# Patient Record
Sex: Female | Born: 1995 | Race: Black or African American | Hispanic: No | Marital: Single | Smoking: Never smoker
Health system: Southern US, Community
[De-identification: ages and names within clinical notes are randomized; demographics above are authoritative.]

---
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
## 2017-02-03 ENCOUNTER — Ambulatory Visit (INDEPENDENT_AMBULATORY_CARE_PROVIDER_SITE_OTHER): Payer: PRIVATE HEALTH INSURANCE

## 2017-02-03 ENCOUNTER — Ambulatory Visit (INDEPENDENT_AMBULATORY_CARE_PROVIDER_SITE_OTHER): Payer: PRIVATE HEALTH INSURANCE | Admitting: Orthopaedic Surgery

## 2017-02-03 ENCOUNTER — Encounter (INDEPENDENT_AMBULATORY_CARE_PROVIDER_SITE_OTHER): Payer: Self-pay | Admitting: Orthopaedic Surgery

## 2017-02-03 DIAGNOSIS — M25561 Pain in right knee: Secondary | ICD-10-CM

## 2017-02-03 DIAGNOSIS — G8929 Other chronic pain: Secondary | ICD-10-CM | POA: Diagnosis not present

## 2017-02-03 NOTE — Addendum Note (Signed)
Addended by: Albertina ParrGARCIA, Sreshta Cressler on: 02/03/2017 09:25 AM   Modules accepted: Orders

## 2017-02-03 NOTE — Progress Notes (Signed)
   Office Visit Note   Patient: Bianca Mathis           Date of Birth: Sep 02, 1995           MRN: [id number] Visit Date: 02/03/2017              Requested by: No referring provider defined for this encounter. PCP: Patient, No Pcp Per   Assessment & Plan: Visit Diagnoses:  1. Chronic pain of right knee     Plan: Impression is right knee pain patellofemoral dysfunction that has failed conservative treatment.  Recommend MRI to rule out structural abnormalities.  Follow-up after the MRI.  Follow-Up Instructions: Return in about 10 days (around 02/13/2017).   Orders:  Orders Placed This Encounter  Procedures  . XR KNEE 3 VIEW RIGHT   No orders of the defined types were placed in this encounter.     Procedures: No procedures performed   Clinical Data: No additional findings.   Subjective: Chief Complaint  Patient presents with  . Right Knee - Pain    Bianca Mathis is a healthy 21 year old female who has right knee pain playing the patella for quite some time.  She had a previous injury several years ago to her right knee.  She states that she has constant pain that is worse with prolonged standing and activity and with running.  She has tried physical therapy 3 times and this is not give her any relief.  She denies any numbness and tingling or significant swelling.    Review of Systems  Constitutional: Negative.   HENT: Negative.   Eyes: Negative.   Respiratory: Negative.   Cardiovascular: Negative.   Endocrine: Negative.   Musculoskeletal: Negative.   Neurological: Negative.   Hematological: Negative.   Psychiatric/Behavioral: Negative.   All other systems reviewed and are negative.    Objective: Vital Signs: There were no vitals taken for this visit.  Physical Exam  Constitutional: She is oriented to person, place, and time. She appears well-developed and well-nourished.  HENT:  Head: Normocephalic and atraumatic.  Eyes: EOM are normal.  Neck: Neck supple.   Pulmonary/Chest: Effort normal.  Abdominal: Soft.  Neurological: She is alert and oriented to person, place, and time.  Skin: Skin is warm. Capillary refill takes less than 2 seconds.  Psychiatric: She has a normal mood and affect. Her behavior is normal. Judgment and thought content normal.  Nursing note and vitals reviewed.   Ortho Exam Right knee exam shows no joint effusion.  Normal range of motion.  Collaterals and cruciates are stable.  Patella tracking is normal.  Negative J sign. Specialty Comments:  No specialty comments available.  Imaging: Xr Knee 3 View Right  Result Date: 02/03/2017 No acute or structural abnormalities.  Slight lateral patellar tilt    PMFS History: There are no active problems to display for this patient.  History reviewed. No pertinent past medical history.  History reviewed. No pertinent family history.  History reviewed. No pertinent surgical history. Social History   Occupational History  . Not on file  Tobacco Use  . Smoking status: Never Smoker  . Smokeless tobacco: Never Used  Substance and Sexual Activity  . Alcohol use: Not on file  . Drug use: Not on file  . Sexual activity: Not on file

## 2017-02-16 ENCOUNTER — Ambulatory Visit (INDEPENDENT_AMBULATORY_CARE_PROVIDER_SITE_OTHER): Payer: Self-pay | Admitting: Orthopaedic Surgery

## 2017-02-20 ENCOUNTER — Ambulatory Visit
Admission: RE | Admit: 2017-02-20 | Discharge: 2017-02-20 | Disposition: A | Discharge status: Home / Self Care | Payer: Self-pay | Source: Ambulatory Visit | Attending: Orthopaedic Surgery | Admitting: Orthopaedic Surgery

## 2017-02-20 DIAGNOSIS — G8929 Other chronic pain: Secondary | ICD-10-CM

## 2017-02-20 DIAGNOSIS — M25561 Pain in right knee: Principal | ICD-10-CM

## 2017-02-23 ENCOUNTER — Ambulatory Visit (INDEPENDENT_AMBULATORY_CARE_PROVIDER_SITE_OTHER): Payer: PRIVATE HEALTH INSURANCE | Admitting: Orthopaedic Surgery

## 2017-02-23 ENCOUNTER — Encounter (INDEPENDENT_AMBULATORY_CARE_PROVIDER_SITE_OTHER): Payer: Self-pay | Admitting: Orthopaedic Surgery

## 2017-02-23 DIAGNOSIS — M25561 Pain in right knee: Secondary | ICD-10-CM

## 2017-02-23 DIAGNOSIS — G8929 Other chronic pain: Secondary | ICD-10-CM

## 2017-02-23 NOTE — Progress Notes (Signed)
   Office Visit Note   Patient: Bianca Mathis           Date of Birth: October 18, 1995           MRN: [id number] Visit Date: 02/23/2017              Requested by: No referring provider defined for this encounter. PCP: Patient, No Pcp Per   Assessment & Plan: Visit Diagnoses:  1. Chronic pain of right knee     Plan: MRI is negative for any structural abnormalities.  Her TT-TG distance is 18 mm.  These results were discussed with the patient.  I do recommend physical therapy to help with the patellofemoral pain that she is experiencing.  No surgical intervention is indicated.  Follow-up as needed.  Follow-Up Instructions: Return if symptoms worsen or fail to improve.   Orders:  No orders of the defined types were placed in this encounter.  No orders of the defined types were placed in this encounter.     Procedures: No procedures performed   Clinical Data: No additional findings.   Subjective: Chief Complaint  Patient presents with  . Right Knee - Follow-up    Patient follows up today for her MRI results.  Knee feels the same.    Review of Systems   Objective: Vital Signs: There were no vitals taken for this visit.  Physical Exam  Ortho Exam Stable exam. Specialty Comments:  No specialty comments available.  Imaging: No results found.   PMFS History: There are no active problems to display for this patient.  History reviewed. No pertinent past medical history.  History reviewed. No pertinent family history.  History reviewed. No pertinent surgical history. Social History   Occupational History  . Not on file  Tobacco Use  . Smoking status: Never Smoker  . Smokeless tobacco: Never Used  Substance and Sexual Activity  . Alcohol use: Not on file  . Drug use: Not on file  . Sexual activity: Not on file

## 2017-09-17 ENCOUNTER — Other Ambulatory Visit: Payer: Self-pay

## 2017-09-17 ENCOUNTER — Emergency Department (HOSPITAL_COMMUNITY)
Admission: EM | Admit: 2017-09-17 | Discharge: 2017-09-18 | Disposition: A | Discharge status: Home / Self Care | Attending: Emergency Medicine | Admitting: Emergency Medicine

## 2017-09-17 ENCOUNTER — Encounter (HOSPITAL_COMMUNITY): Payer: Self-pay | Admitting: *Deleted

## 2017-09-17 DIAGNOSIS — M5430 Sciatica, unspecified side: Secondary | ICD-10-CM

## 2017-09-17 DIAGNOSIS — R3121 Asymptomatic microscopic hematuria: Secondary | ICD-10-CM | POA: Insufficient documentation

## 2017-09-17 DIAGNOSIS — M5431 Sciatica, right side: Secondary | ICD-10-CM | POA: Insufficient documentation

## 2017-09-17 DIAGNOSIS — M545 Low back pain: Secondary | ICD-10-CM | POA: Diagnosis present

## 2017-09-17 NOTE — ED Triage Notes (Signed)
Pt ambulatory with steady gait to triage room. Pain and numbness in the right back (and right buttocks) that has been "on and off for about a year, but more persistent over the past 2-3 days". Pt says she has been in PT for the same for tendonitis in her hip flexor. No meds PTA. No loss of control of urine or bowel, no new onset of weakness in extremities.

## 2017-09-18 ENCOUNTER — Encounter (HOSPITAL_COMMUNITY): Payer: Self-pay | Admitting: Emergency Medicine

## 2017-09-18 ENCOUNTER — Emergency Department (HOSPITAL_COMMUNITY)

## 2017-09-18 LAB — URINALYSIS, ROUTINE W REFLEX MICROSCOPIC
Bilirubin Urine: NEGATIVE
Glucose, UA: NEGATIVE mg/dL
Ketones, ur: NEGATIVE mg/dL
Leukocytes, UA: NEGATIVE
Nitrite: NEGATIVE
Protein, ur: NEGATIVE mg/dL
Specific Gravity, Urine: 1.02 (ref 1.005–1.030)
pH: 6 (ref 5.0–8.0)

## 2017-09-18 LAB — POC URINE PREG, ED: Preg Test, Ur: NEGATIVE

## 2017-09-18 MED ORDER — NAPROXEN 250 MG PO TABS
500.0000 mg | ORAL_TABLET | Freq: Once | ORAL | Status: AC
Start: 2017-09-18 — End: 2017-09-18
  Administered 2017-09-18: 500 mg via ORAL
  Filled 2017-09-18: qty 2

## 2017-09-18 MED ORDER — MELOXICAM 7.5 MG PO TABS: 7.5000 mg | ORAL_TABLET | Freq: Every day | ORAL | 0 refills | Status: DC

## 2017-09-18 MED ORDER — ACETAMINOPHEN 500 MG PO TABS
1000.0000 mg | ORAL_TABLET | Freq: Once | ORAL | Status: AC
  Administered 2017-09-18: 1000 mg via ORAL
  Filled 2017-09-18: qty 2

## 2017-09-18 NOTE — ED Provider Notes (Signed)
[facility] EMERGENCY DEPARTMENT Provider Note   CSN: [id number] Arrival date & time: 09/17/17  2136     History   Chief Complaint Chief Complaint  Patient presents with  . Back Pain    HPI Bianca Mathis is a 22 y.o. female.  The history is provided by the patient.  Back Pain   This is a chronic problem. The current episode started more than 1 week ago (over a year). The problem occurs constantly. The problem has not changed since onset.The pain is associated with no known injury. The pain is present in the gluteal region. The quality of the pain is described as stabbing. The pain radiates to the right thigh. The pain is at a severity of 5/10. The pain is moderate. The symptoms are aggravated by bending and certain positions. The pain is the same all the time. Pertinent negatives include no chest pain, no fever, no numbness, no weight loss, no headaches, no abdominal pain, no abdominal swelling, no bowel incontinence, no perianal numbness, no bladder incontinence, no dysuria, no pelvic pain, no leg pain, no paresthesias, no paresis, no tingling and no weakness. She has tried nothing for the symptoms. The treatment provided no relief. Risk factors include obesity.  Has done PT for this in the past.  Wants to know why it is still going on. No f/c/r.  No neuro deficits.  No bowel or bladder incontinence.    History reviewed. No pertinent past medical history.  There are no active problems to display for this patient.   History reviewed. No pertinent surgical history.   OB History   None      Home Medications    Prior to Admission medications   Not on File    Family History No family history on file.  Social History Social History   Tobacco Use  . Smoking status: Never Smoker  . Smokeless tobacco: Never Used  Substance Use Topics  . Alcohol use: Never    Frequency: Never  . Drug use: Never     Allergies   Shellfish allergy and  Nickel   Review of Systems Review of Systems  Constitutional: Negative for fever and weight loss.  HENT: Negative for sore throat and trouble swallowing.   Eyes: Negative for photophobia.  Respiratory: Negative for shortness of breath.   Cardiovascular: Negative for chest pain, palpitations and leg swelling.  Gastrointestinal: Negative for abdominal pain and bowel incontinence.  Genitourinary: Negative for bladder incontinence, difficulty urinating, dysuria, flank pain, genital sores and pelvic pain.  Musculoskeletal: Positive for back pain. Negative for gait problem, joint swelling, myalgias and neck pain.  Neurological: Negative for tingling, weakness, numbness, headaches and paresthesias.  Psychiatric/Behavioral: Negative for dysphoric mood.  All other systems reviewed and are negative.    Physical Exam Updated Vital Signs BP (!) 102/44   Pulse 80   Temp 98.9 F (37.2 C) (Oral)   Resp 18   LMP 08/23/2017   SpO2 99%   Physical Exam  Constitutional: She is oriented to person, place, and time. She appears well-developed and well-nourished. No distress.  HENT:  Head: Normocephalic and atraumatic.  Mouth/Throat: No oropharyngeal exudate.  Eyes: Pupils are equal, round, and reactive to light. Conjunctivae are normal.  Neck: Normal range of motion. Neck supple.  Cardiovascular: Normal rate, regular rhythm, normal heart sounds and intact distal pulses.  Pulmonary/Chest: Effort normal and breath sounds normal. No stridor. She has no wheezes. She has no rales.  Abdominal: Soft. Bowel sounds  are normal. She exhibits no mass. There is no tenderness. There is no rebound and no guarding.  Musculoskeletal: Normal range of motion. She exhibits no tenderness or deformity.       Right hip: Normal.       Left hip: Normal.       Right knee: Normal.       Left knee: Normal.       Right ankle: Normal. Achilles tendon normal.       Left ankle: Normal. Achilles tendon normal.       Cervical  back: Normal.       Thoracic back: Normal.       Lumbar back: Normal.  Neurological: She is alert and oriented to person, place, and time. She displays normal reflexes. No cranial nerve deficit or sensory deficit. She exhibits normal muscle tone.  Skin: Skin is warm and dry. Capillary refill takes less than 2 seconds. She is not diaphoretic.  Psychiatric: She has a normal mood and affect.     ED Treatments / Results  Labs (all labs ordered are listed, but only abnormal results are displayed) Results for orders placed or performed during the hospital encounter of 09/17/17  Urinalysis, Routine w reflex microscopic  Result Value Ref Range   Color, Urine YELLOW YELLOW   APPearance HAZY (A) CLEAR   Specific Gravity, Urine 1.020 1.005 - 1.030   pH 6.0 5.0 - 8.0   Glucose, UA NEGATIVE NEGATIVE mg/dL   Hgb urine dipstick MODERATE (A) NEGATIVE   Bilirubin Urine NEGATIVE NEGATIVE   Ketones, ur NEGATIVE NEGATIVE mg/dL   Protein, ur NEGATIVE NEGATIVE mg/dL   Nitrite NEGATIVE NEGATIVE   Leukocytes, UA NEGATIVE NEGATIVE   RBC / HPF 6-10 0 - 5 RBC/hpf   WBC, UA 0-5 0 - 5 WBC/hpf   Bacteria, UA RARE (A) NONE SEEN   Squamous Epithelial / LPF 6-10 0 - 5   Mucus PRESENT   POC Urine Pregnancy, ED ([facility] NOT [facility] at [facility])  Result Value Ref Range   Preg Test, Ur NEGATIVE NEGATIVE   Dg Lumbar Spine Complete  Result Date: 09/18/2017 CLINICAL DATA:  22 year old female with intermittent back pain x1 year. EXAM: LUMBAR SPINE - COMPLETE 4+ VIEW COMPARISON:  None. FINDINGS: There is no evidence of lumbar spine fracture. Alignment is normal. Intervertebral disc spaces are maintained. IMPRESSION: Negative. Electronically Signed   By: Elgie CollardArash  Radparvar M.D.   On: 09/18/2017 01:23    EKG None  Radiology Dg Lumbar Spine Complete  Result Date: 09/18/2017 CLINICAL DATA:  22 year old female with intermittent back pain x1 year. EXAM: LUMBAR SPINE - COMPLETE 4+ VIEW COMPARISON:  None. FINDINGS: There is no  evidence of lumbar spine fracture. Alignment is normal. Intervertebral disc spaces are maintained. IMPRESSION: Negative. Electronically Signed   By: Elgie CollardArash  Radparvar M.D.   On: 09/18/2017 01:23    Procedures Procedures (including critical care time)  Medications Ordered in ED Medications  naproxen (NAPROSYN) tablet 500 mg (500 mg Oral Given 09/18/17 0114)  acetaminophen (TYLENOL) tablet 1,000 mg (1,000 mg Oral Given 09/18/17 0114)     Gait is intact.    Final Clinical Impressions(s) / ED Diagnoses   NSAIDs heat and follow up with sports medicine.     Return for leg or calf swelling or pain, numbness, changes in vision or speech, fevers >100.4 unrelieved by medication, shortness of breath, intractable vomiting, or diarrhea, abdominal pain, Inability to tolerate liquids or food, cough, altered mental status or any concerns. No signs  of systemic illness or infection. The patient is nontoxic-appearing on exam and vital signs are within normal limits. Will refer to urology for microscopy hematuria as patient is asymptomatic.  I have reviewed the triage vital signs and the nursing notes. Pertinent labs &imaging results that were available during my care of the patient were reviewed by me and considered in my medical decision making (see chart for details).  After history, exam, and medical workup I feel the patient has been appropriately medically screened and is safe for discharge home. Pertinent diagnoses were discussed with the patient. Patient was given return precautions.   Elayjah Chaney, MD 09/18/17 [id number]

## 2019-01-13 ENCOUNTER — Emergency Department (HOSPITAL_COMMUNITY)
Admission: EM | Admit: 2019-01-13 | Discharge: 2019-01-13 | Disposition: A | Discharge status: Home / Self Care | Attending: Emergency Medicine | Admitting: Emergency Medicine

## 2019-01-13 ENCOUNTER — Encounter (HOSPITAL_COMMUNITY): Payer: Self-pay | Admitting: Emergency Medicine

## 2019-01-13 ENCOUNTER — Other Ambulatory Visit: Payer: Self-pay

## 2019-01-13 ENCOUNTER — Emergency Department (HOSPITAL_COMMUNITY)

## 2019-01-13 DIAGNOSIS — W228XXA Striking against or struck by other objects, initial encounter: Secondary | ICD-10-CM | POA: Diagnosis not present

## 2019-01-13 DIAGNOSIS — Y9301 Activity, walking, marching and hiking: Secondary | ICD-10-CM | POA: Insufficient documentation

## 2019-01-13 DIAGNOSIS — Y998 Other external cause status: Secondary | ICD-10-CM | POA: Diagnosis not present

## 2019-01-13 DIAGNOSIS — S90122A Contusion of left lesser toe(s) without damage to nail, initial encounter: Secondary | ICD-10-CM | POA: Insufficient documentation

## 2019-01-13 DIAGNOSIS — Y92018 Other place in single-family (private) house as the place of occurrence of the external cause: Secondary | ICD-10-CM | POA: Insufficient documentation

## 2019-01-13 DIAGNOSIS — Z79899 Other long term (current) drug therapy: Secondary | ICD-10-CM | POA: Diagnosis not present

## 2019-01-13 DIAGNOSIS — S99922A Unspecified injury of left foot, initial encounter: Secondary | ICD-10-CM | POA: Diagnosis present

## 2019-01-13 NOTE — ED Provider Notes (Signed)
[facility] EMERGENCY DEPARTMENT Provider Note   CSN: [id number] Arrival date & time: 01/13/19  1354     History   Chief Complaint Chief Complaint  Patient presents with  . Toe Pain    HPI Bianca Mathis is a 23 y.o. female.     23 year old female with no chronic medical conditions presents for evaluation of pain and bruising in her left fifth toe.  Patient was walking at home today and accidentally struck her toes on the corner of a coffee table.  She developed bruising in the left fifth toe wound was concerned she may have broken her toe so came in for evaluation.  No other injuries.  She denies any other foot or ankle pain.  She has otherwise been well this week without fever cough vomiting or diarrhea.  The history is provided by the patient.  Toe Pain    History reviewed. No pertinent past medical history.  There are no active problems to display for this patient.   History reviewed. No pertinent surgical history.   OB History   No obstetric history on file.      Home Medications    Prior to Admission medications   Medication Sig Start Date End Date Taking? Authorizing Provider  meloxicam (MOBIC) 7.5 MG tablet Take 1 tablet (7.5 mg total) by mouth daily. 09/18/17   Palumbo, April, MD    Family History No family history on file.  Social History Social History   Tobacco Use  . Smoking status: Never Smoker  . Smokeless tobacco: Never Used  Substance Use Topics  . Alcohol use: Never    Frequency: Never  . Drug use: Never     Allergies   Shellfish allergy and Nickel   Review of Systems Review of Systems  All systems reviewed and were reviewed and were negative except as stated in the HPI   Physical Exam Updated Vital Signs BP 117/74 (BP Location: Right Arm)   Pulse 76   Temp 98 F (36.7 C) (Temporal)   Resp 14   Ht 5\' 9"  (1.753 m)   Wt 93 kg   LMP 12/20/2018 (Exact Date)   SpO2 98%   BMI 30.27 kg/m   Physical  Exam Vitals signs and nursing note reviewed.  Constitutional:      General: She is not in acute distress.    Appearance: Normal appearance. She is well-developed.  HENT:     Head: Normocephalic and atraumatic.     Mouth/Throat:     Mouth: Mucous membranes are moist.  Eyes:     Conjunctiva/sclera: Conjunctivae normal.     Pupils: Pupils are equal, round, and reactive to light.  Neck:     Musculoskeletal: Normal range of motion and neck supple.  Cardiovascular:     Rate and Rhythm: Normal rate and regular rhythm.     Heart sounds: Normal heart sounds. No murmur. No friction rub. No gallop.   Pulmonary:     Effort: Pulmonary effort is normal. No respiratory distress.     Breath sounds: No wheezing or rales.  Abdominal:     General: Bowel sounds are normal.     Palpations: Abdomen is soft.     Tenderness: There is no abdominal tenderness. There is no guarding or rebound.  Musculoskeletal: Normal range of motion.        General: Tenderness present.     Comments: Tenderness and mild soft tissue swelling of the left great toe with 1 cm contusion.  No deformity.  Nail is intact, no nailbed injury.  No lacerations or bleeding.  The remainder of the left foot is normal without swelling or tenderness.  Neurovascularly intact.  Skin:    General: Skin is warm and dry.     Capillary Refill: Capillary refill takes less than 2 seconds.     Findings: No rash.  Neurological:     General: No focal deficit present.     Mental Status: She is alert and oriented to person, place, and time.     Cranial Nerves: No cranial nerve deficit.     Comments: Normal strength 5/5 in upper and lower extremities, normal coordination      ED Treatments / Results  Labs (all labs ordered are listed, but only abnormal results are displayed) Labs Reviewed - No data to display  EKG None  Radiology Dg Foot Complete Left  Result Date: 01/13/2019 CLINICAL DATA:  Toe injury. EXAM: LEFT FOOT - COMPLETE 3+ VIEW  COMPARISON:  No prior. FINDINGS: Soft tissue structures are unremarkable. No acute bony abnormality. No evidence of fracture or dislocation. IMPRESSION: No acute abnormality. Electronically Signed   By: Marcello Moores  Register   On: 01/13/2019 14:58    Procedures Procedures (including critical care time)  Medications Ordered in ED Medications - No data to display   Initial Impression / Assessment and Plan / ED Course  I have reviewed the triage vital signs and the nursing notes.  Pertinent labs & imaging results that were available during my care of the patient were reviewed by me and considered in my medical decision making (see chart for details).       23 year old female with no chronic medical conditions presents with injury to the left fifth toe after she struck it on a coffee table at her home earlier today.  Developed mild swelling and bruising of the left fifth toe.  No other injuries.  On exam here vitals normal and well-appearing.  There is mild soft tissue swelling tenderness and bruising of the left great toe but no deformity and the nail is intact.  X-rays of the left foot are normal without evidence of any bony abnormality.  I personally reviewed these x-rays.  Offered ibuprofen but patient declined.  Discussed supportive care measures with cold compress 3 times daily for the next few days as well as ibuprofen as needed for pain.  PCP follow-up in 2 weeks if pain persist or worsens.  Final Clinical Impressions(s) / ED Diagnoses   Final diagnoses:  Contusion of fifth toe of left foot, initial encounter    ED Discharge Orders    None       Harlene Salts, MD 01/13/19 1614

## 2019-01-13 NOTE — ED Notes (Signed)
ED Provider at bedside. 

## 2019-01-13 NOTE — ED Triage Notes (Signed)
Pt reports hitting her toe on her coffee table and possibly breaking same. Pt here to get toes buddy taped.

## 2019-01-13 NOTE — Discharge Instructions (Addendum)
X-rays of the left foot and toes were normal today.  No evidence of broken bone.  You do have contusion/bruise of the toe.  May apply an ice pack for 10 to 15 minutes 3 times daily to help with pain and swelling.  May take ibuprofen 600 mg every 6-8 hours as needed for pain.  Wear a flat soled shoe for the next week.  Follow-up with your doctor if no improvement in 1 to 2 weeks.

## 2020-02-20 IMAGING — DX DG FOOT COMPLETE 3+V*L*
3 series · 3 of 3 positions shown · non-contrast
Comparison: No prior.

CLINICAL DATA: Toe injury.

EXAM:
LEFT FOOT - COMPLETE 3+ VIEW

[foot ap]
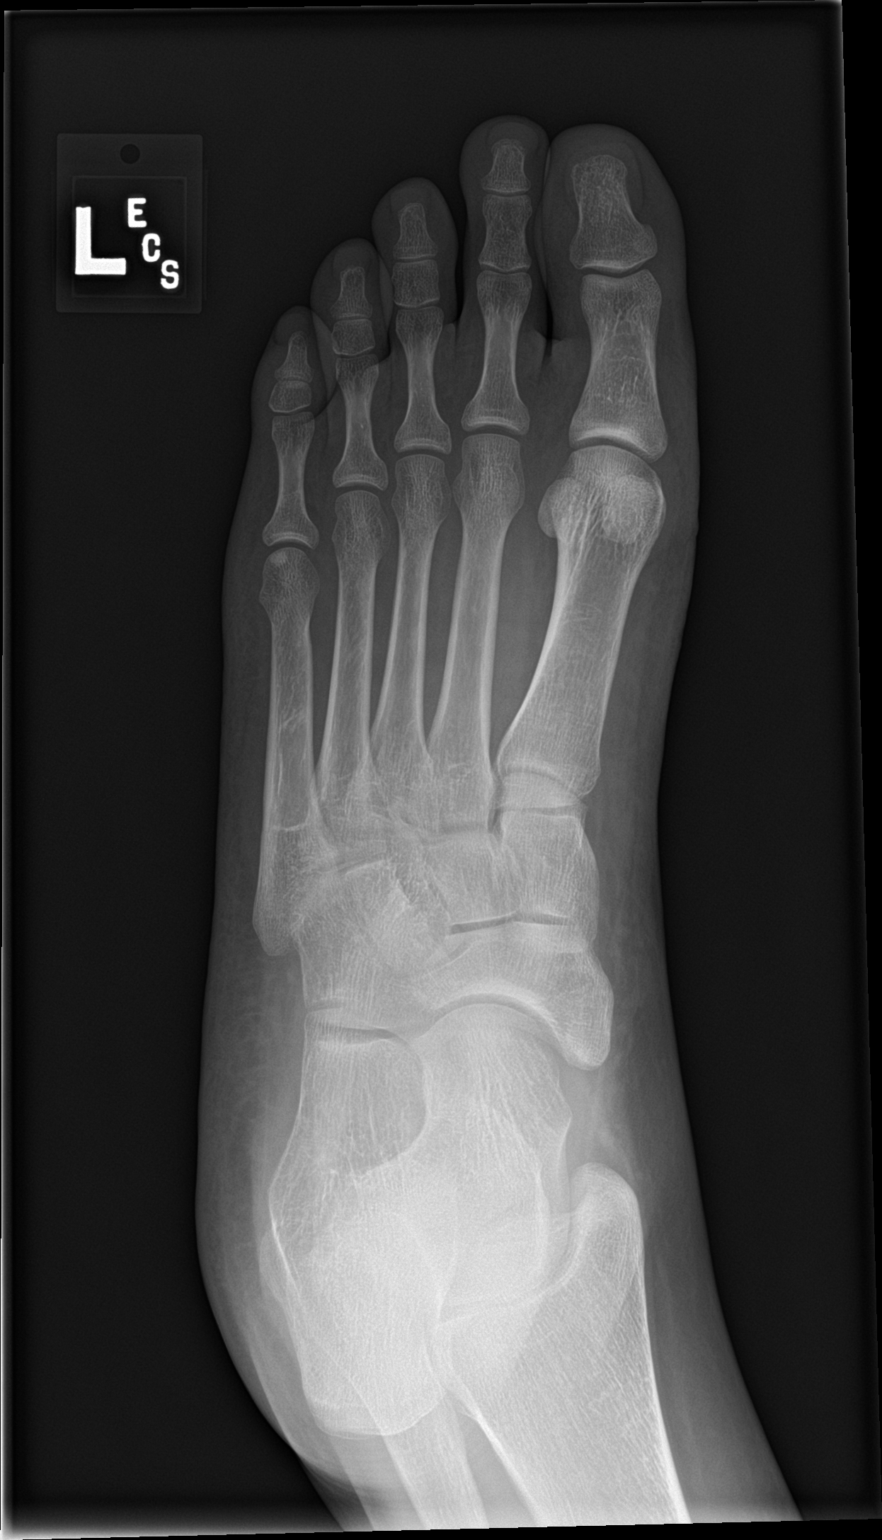

[foot obl]
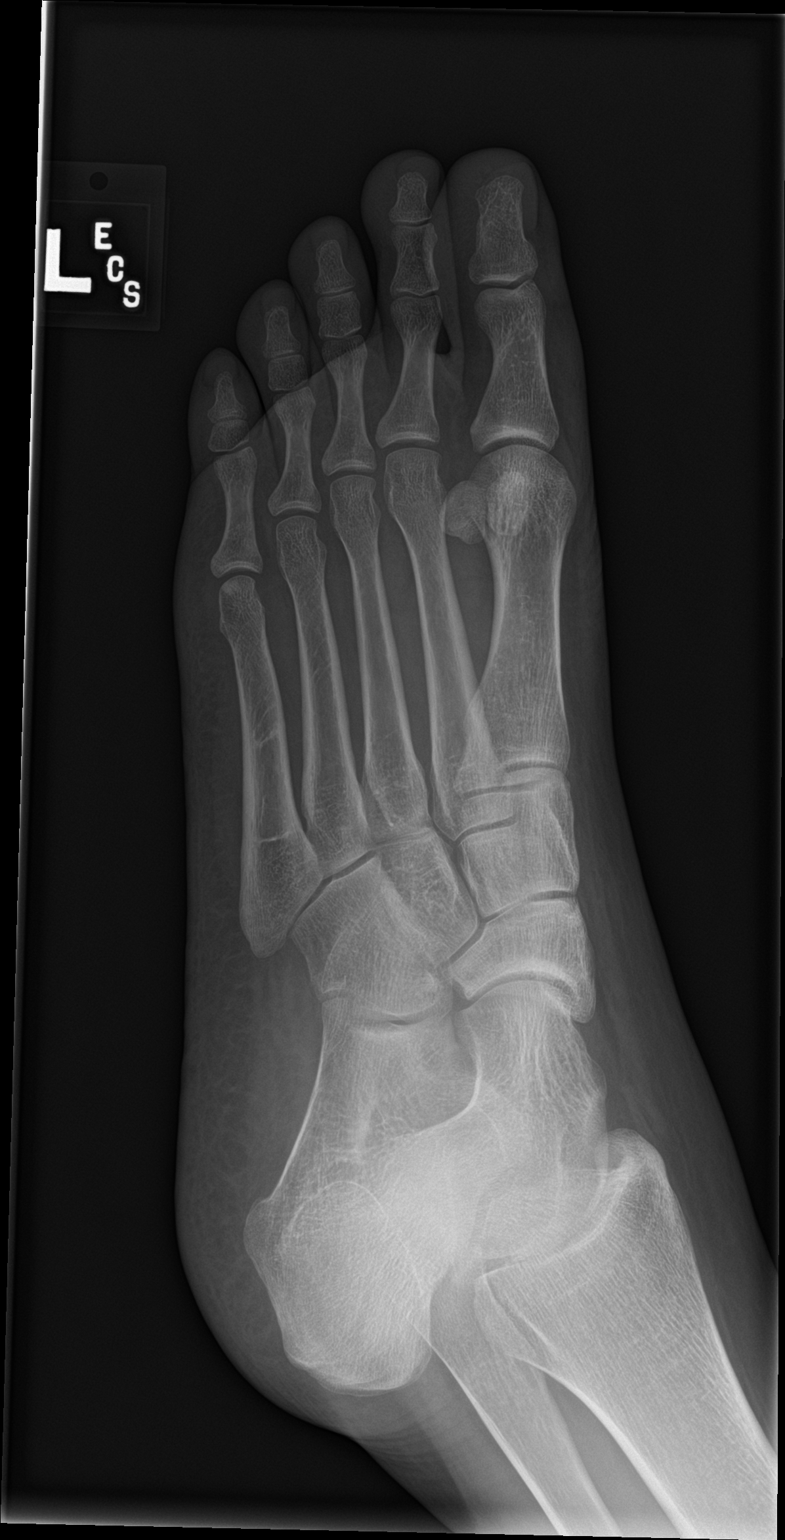

[foot lat]
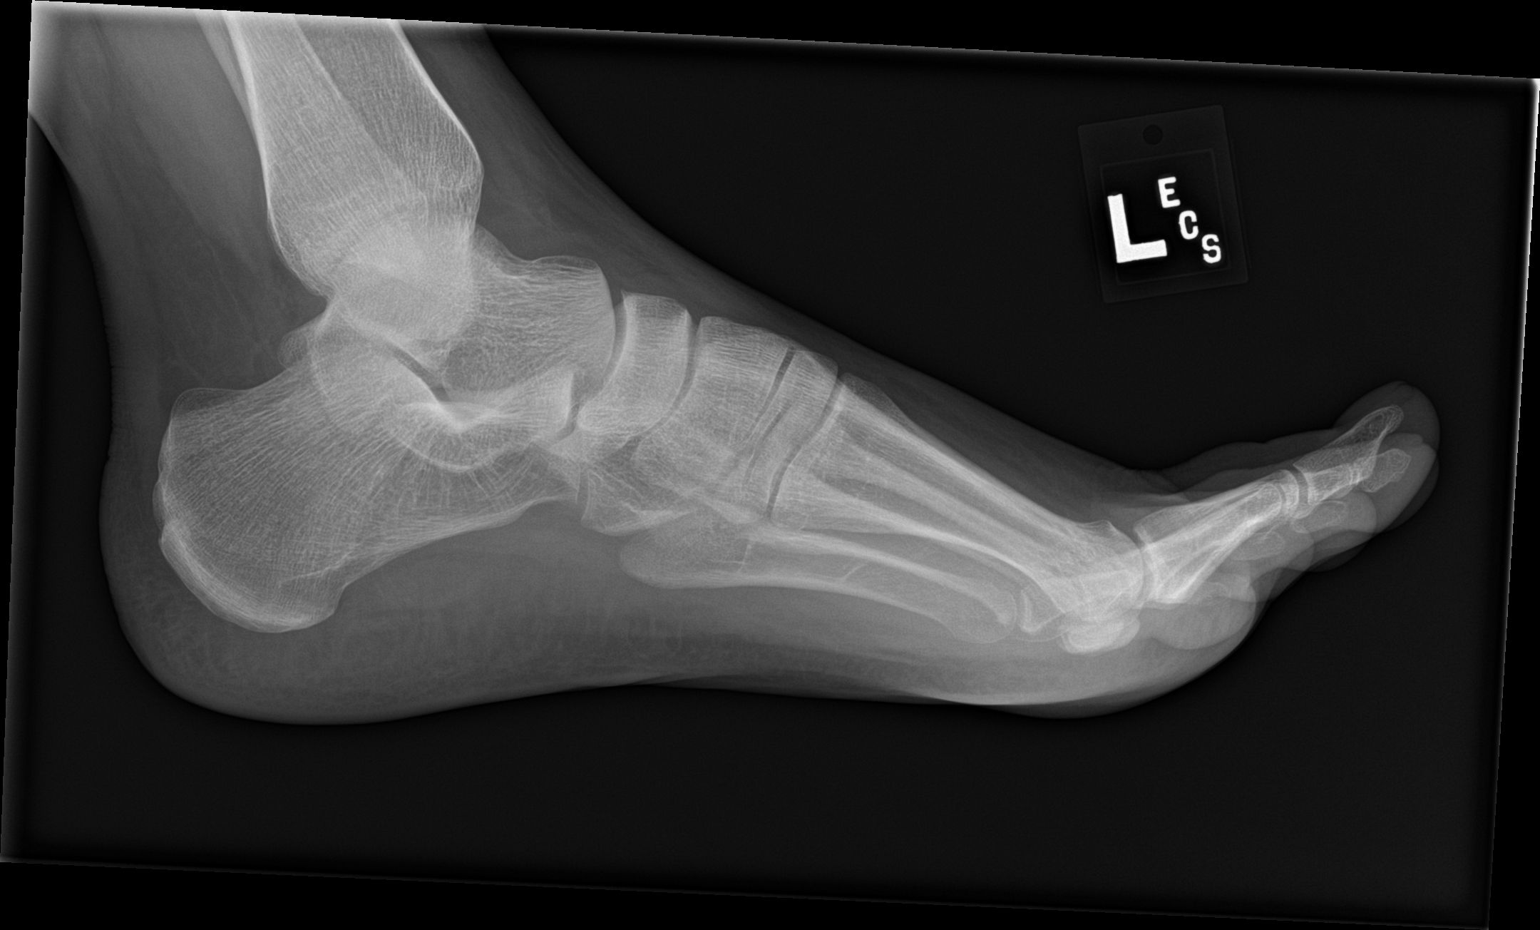

[3 of 3 positions shown; findings below may reference images not displayed]

FINDINGS: Soft tissue structures are unremarkable. No acute bony abnormality.
No evidence of fracture or dislocation.
IMPRESSION: No acute abnormality.

## 2020-06-29 ENCOUNTER — Encounter (HOSPITAL_COMMUNITY): Payer: Self-pay | Admitting: Emergency Medicine

## 2020-06-29 ENCOUNTER — Emergency Department (HOSPITAL_COMMUNITY): Payer: Self-pay

## 2020-06-29 ENCOUNTER — Emergency Department (HOSPITAL_COMMUNITY)
Admission: EM | Admit: 2020-06-29 | Discharge: 2020-06-29 | Disposition: A | Discharge status: Home / Self Care | Payer: Self-pay | Attending: Emergency Medicine | Admitting: Emergency Medicine

## 2020-06-29 ENCOUNTER — Ambulatory Visit (HOSPITAL_COMMUNITY)
Admission: EM | Admit: 2020-06-29 | Discharge: 2020-06-29 | Disposition: A | Discharge status: Home / Self Care | Attending: Medical Oncology | Admitting: Medical Oncology

## 2020-06-29 ENCOUNTER — Other Ambulatory Visit: Payer: Self-pay

## 2020-06-29 DIAGNOSIS — R1011 Right upper quadrant pain: Secondary | ICD-10-CM | POA: Insufficient documentation

## 2020-06-29 DIAGNOSIS — R1031 Right lower quadrant pain: Secondary | ICD-10-CM | POA: Insufficient documentation

## 2020-06-29 DIAGNOSIS — R1032 Left lower quadrant pain: Secondary | ICD-10-CM | POA: Insufficient documentation

## 2020-06-29 LAB — CBC WITH DIFFERENTIAL/PLATELET
Abs Immature Granulocytes: 0.03 K/uL (ref 0.00–0.07)
Basophils Absolute: 0 K/uL (ref 0.0–0.1)
Basophils Relative: 0 %
Eosinophils Absolute: 0.1 K/uL (ref 0.0–0.5)
Eosinophils Relative: 1 %
HCT: 32.6 % — ABNORMAL LOW (ref 36.0–46.0)
Hemoglobin: 10.9 g/dL — ABNORMAL LOW (ref 12.0–15.0)
Immature Granulocytes: 0 %
Lymphocytes Relative: 19 %
Lymphs Abs: 1.8 K/uL (ref 0.7–4.0)
MCH: 29.9 pg (ref 26.0–34.0)
MCHC: 33.4 g/dL (ref 30.0–36.0)
MCV: 89.6 fL (ref 80.0–100.0)
Monocytes Absolute: 0.8 K/uL (ref 0.1–1.0)
Monocytes Relative: 8 %
Neutro Abs: 7 K/uL (ref 1.7–7.7)
Neutrophils Relative %: 72 %
Platelets: 368 K/uL (ref 150–400)
RBC: 3.64 MIL/uL — ABNORMAL LOW (ref 3.87–5.11)
RDW: 12.4 % (ref 11.5–15.5)
WBC: 9.7 K/uL (ref 4.0–10.5)
nRBC: 0 % (ref 0.0–0.2)

## 2020-06-29 LAB — POCT URINALYSIS DIPSTICK, ED / UC
Glucose, UA: NEGATIVE mg/dL
Ketones, ur: 15 mg/dL — AB
Leukocytes,Ua: NEGATIVE
Nitrite: NEGATIVE
Protein, ur: 30 mg/dL — AB
Specific Gravity, Urine: 1.025 (ref 1.005–1.030)
Urobilinogen, UA: >=8 mg/dL (ref 0.0–1.0)
pH: 6 (ref 5.0–8.0)

## 2020-06-29 LAB — COMPREHENSIVE METABOLIC PANEL WITH GFR
ALT: 18 U/L (ref 0–44)
AST: 26 U/L (ref 15–41)
Albumin: 3.2 g/dL — ABNORMAL LOW (ref 3.5–5.0)
Alkaline Phosphatase: 83 U/L (ref 38–126)
Anion gap: 7 (ref 5–15)
BUN: <5 mg/dL — ABNORMAL LOW (ref 6–20)
CO2: 25 mmol/L (ref 22–32)
Calcium: 8.9 mg/dL (ref 8.9–10.3)
Chloride: 104 mmol/L (ref 98–111)
Creatinine, Ser: 0.66 mg/dL (ref 0.44–1.00)
GFR, Estimated: >60 mL/min (ref >60)
Glucose, Bld: 92 mg/dL (ref 70–99)
Potassium: 4.1 mmol/L (ref 3.5–5.1)
Sodium: 136 mmol/L (ref 135–145)
Total Bilirubin: 0.6 mg/dL (ref 0.3–1.2)
Total Protein: 7.1 g/dL (ref 6.5–8.1)

## 2020-06-29 LAB — LIPASE, BLOOD: Lipase: 36 U/L (ref 11–51)

## 2020-06-29 LAB — POC URINE PREG, ED: Preg Test, Ur: NEGATIVE

## 2020-06-29 MED ORDER — PREDNISONE 10 MG PO TABS: ORAL_TABLET | ORAL | 0 refills | Status: AC

## 2020-06-29 NOTE — ED Provider Notes (Signed)
[facility]    CSN: [id number] Arrival date & time: 06/29/20  1420      History   Chief Complaint Chief Complaint  Patient presents with  . Abdominal Pain    HPI Bianca Mathis is a 25 y.o. female.   HPI   Abdominal Pain: Patient states that she has had progressively worse right upper quadrant pain for the past 2 weeks.  In addition she states that her cycle lasted for 9 days instead of 4.  She states that her abdominal pain is dull 10-10 in nature when she takes a deep breath, bends or picks objects up.  4-10 at rest.  She denies any vomiting, fevers, changes in bowel habits but she does state that her urine has looked orange today. She has not tried anything for symptoms other than trying to stay hydrated with water.   History reviewed. No pertinent past medical history.  There are no problems to display for this patient.   History reviewed. No pertinent surgical history.  OB History   No obstetric history on file.      Home Medications    Prior to Admission medications   Medication Sig Start Date End Date Taking? Authorizing Provider  meloxicam (MOBIC) 7.5 MG tablet Take 1 tablet (7.5 mg total) by mouth daily. 09/18/17   Palumbo, April, MD    Family History History reviewed. No pertinent family history.  Social History Social History   Tobacco Use  . Smoking status: Never Smoker  . Smokeless tobacco: Never Used  Vaping Use  . Vaping Use: Never used  Substance Use Topics  . Alcohol use: Never  . Drug use: Never     Allergies   Shellfish allergy and Nickel   Review of Systems Review of Systems  As stated above in HPI Physical Exam Triage Vital Signs ED Triage Vitals  Enc Vitals Group     BP 06/29/20 1454 114/62     Pulse Rate 06/29/20 1454 (!) 105     Resp 06/29/20 1454 18     Temp 06/29/20 1454 98.7 F (37.1 C)     Temp Source 06/29/20 1454 Oral     SpO2 06/29/20 1454 100 %     Weight --      Height --      Head  Circumference --      Peak Flow --      Pain Score 06/29/20 1453 4     Pain Loc --      Pain Edu? --      Excl. in GC? --    No data found.  Updated Vital Signs BP 114/62 (BP Location: Right Arm)   Pulse (!) 105   Temp 98.7 F (37.1 C) (Oral)   Resp 18   LMP 06/19/2020   SpO2 100%   Physical Exam Vitals and nursing note reviewed.  Constitutional:      General: She is in acute distress (mild with bending movements).     Appearance: She is well-developed. She is not ill-appearing, toxic-appearing or diaphoretic.  HENT:     Head: Normocephalic and atraumatic.     Mouth/Throat:     Mouth: Mucous membranes are moist.  Eyes:     Extraocular Movements: Extraocular movements intact.  Cardiovascular:     Rate and Rhythm: Normal rate and regular rhythm.     Heart sounds: Normal heart sounds.  Pulmonary:     Effort: Pulmonary effort is normal.     Breath sounds: Normal  breath sounds.  Abdominal:     General: Abdomen is flat. Bowel sounds are normal. There is distension (mild).     Palpations: Abdomen is rigid. There is hepatomegaly. There is no pulsatile mass.     Tenderness: There is abdominal tenderness in the right upper quadrant. There is guarding. There is no right CVA tenderness, left CVA tenderness or rebound. Positive signs include McBurney's sign. Negative signs include Murphy's sign, Rovsing's sign, psoas sign and obturator sign.     Hernia: No hernia is present.  Skin:    General: Skin is warm.     Coloration: Skin is not jaundiced.  Neurological:     Mental Status: She is alert and oriented to person, place, and time.      UC Treatments / Results  Labs (all labs ordered are listed, but only abnormal results are displayed) Labs Reviewed  POCT URINALYSIS DIPSTICK, ED / UC - Abnormal; Notable for the following components:      Result Value   Bilirubin Urine SMALL (*)    Ketones, ur 15 (*)    Hgb urine dipstick LARGE (*)    Protein, ur 30 (*)    All other  components within normal limits  URINE CULTURE  POC URINE PREG, ED    EKG   Radiology No results found.  Procedures Procedures (including critical care time)  Medications Ordered in UC Medications - No data to display  Initial Impression / Assessment and Plan / UC Course  I have reviewed the triage vital signs and the nursing notes.  Pertinent labs & imaging results that were available during my care of the patient were reviewed by me and considered in my medical decision making (see chart for details).     New.  I discussed my concerns with patient.  She is agreeable to go to the emergency room.  She elects to go via private vehicle to rule out acute cholecystitis.  She will be n.p.o. until informed that she can eat or drink by ER staff. Final Clinical Impressions(s) / UC Diagnoses   Final diagnoses:  RUQ pain     Discharge Instructions     Please go directly to the emergency room     ED Prescriptions    None     PDMP not reviewed this encounter.   Rushie Chestnut, [place] 06/29/20 [phone]

## 2020-06-29 NOTE — ED Provider Notes (Signed)
[facility] EMERGENCY DEPARTMENT Provider Note   CSN: [id number] Arrival date & time: 06/29/20  1613     History Chief Complaint  Patient presents with  . Abdominal Pain    Bianca Mathis is a 25 y.o. female.  HPI Patient is a 25 year old female with a minimal medical history present with a chief complaint of right-sided abdominal pain progressive over the past 2 weeks.  Patient had notable change of her symptoms in the last 24 hours.  She endorses that she had a change in her urine today where her urine became orange which prompted her to come to the emergency department for further evaluation management.  Patient initially evaluated at the urgent care center and transferred to emergency department further evaluation management.  Patient endorses continued pain and it seems to be more right-sided pain but she does have general allover discomfort.  She Dors is a family history of ulcerative colitis as well as nephrolithiasis.  She personally has no history of either. Patient states she is been trying to take Tylenol but has not had no improvement of symptoms.  Movement makes her symptoms significantly worse.    History reviewed. No pertinent past medical history.  There are no problems to display for this patient.   History reviewed. No pertinent surgical history.   OB History   No obstetric history on file.     History reviewed. No pertinent family history.  Social History   Tobacco Use  . Smoking status: Never Smoker  . Smokeless tobacco: Never Used  Vaping Use  . Vaping Use: Never used  Substance Use Topics  . Alcohol use: Never  . Drug use: Never    Home Medications Prior to Admission medications   Medication Sig Start Date End Date Taking? Authorizing Provider  meloxicam (MOBIC) 7.5 MG tablet Take 1 tablet (7.5 mg total) by mouth daily. 09/18/17   Palumbo, April, MD    Allergies    Shellfish allergy and Nickel  Review of Systems   Review  of Systems  Constitutional: Negative for chills and fever.  HENT: Negative for ear pain and sore throat.   Eyes: Negative for pain and visual disturbance.  Respiratory: Negative for cough and shortness of breath.   Cardiovascular: Negative for chest pain and palpitations.  Gastrointestinal: Positive for abdominal pain. Negative for vomiting.  Genitourinary: Negative for dysuria and hematuria.  Musculoskeletal: Negative for arthralgias and back pain.  Skin: Negative for color change and rash.  Neurological: Negative for seizures and syncope.  All other systems reviewed and are negative.   Physical Exam Updated Vital Signs BP 106/69   Pulse 91   Temp 99.1 F (37.3 C) (Oral)   Resp 14   Ht 5\' 9"  (1.753 m)   Wt 95.3 kg   LMP 06/19/2020   SpO2 99%   BMI 31.01 kg/m   Physical Exam Vitals and nursing note reviewed.  Constitutional:      General: She is not in acute distress.    Appearance: She is well-developed.  HENT:     Head: Normocephalic and atraumatic.  Eyes:     Conjunctiva/sclera: Conjunctivae normal.  Cardiovascular:     Rate and Rhythm: Normal rate and regular rhythm.     Heart sounds: No murmur heard.   Pulmonary:     Effort: Pulmonary effort is normal. No respiratory distress.     Breath sounds: Normal breath sounds.  Abdominal:     Palpations: Abdomen is soft.  Tenderness: There is abdominal tenderness in the right upper quadrant, right lower quadrant and left lower quadrant. There is no right CVA tenderness, left CVA tenderness or guarding. Negative signs include Murphy's sign and McBurney's sign.  Musculoskeletal:     Cervical back: Neck supple.  Skin:    General: Skin is warm and dry.  Neurological:     Mental Status: She is alert.     ED Results / Procedures / Treatments   Labs (all labs ordered are listed, but only abnormal results are displayed) Labs Reviewed  COMPREHENSIVE METABOLIC PANEL - Abnormal; Notable for the following components:       Result Value   BUN <5 (*)    Albumin 3.2 (*)    All other components within normal limits  LIPASE, BLOOD    EKG None  Radiology US Abdomen Limited RUQ (LIVER/GB)  Result Date: 06/29/2020 CLINICAL DATA:  Right upper quadrant pain. EXAM: ULTRASOUND ABDOMEN LIMITED RIGHT UPPER QUADRANT COMPARISON:  None. FINDINGS: Gallbladder: Physiologically distended. No gallstones or wall thickening visualized. No sonographic Murphy sign noted by sonographer. Common bile duct: Diameter: 3 mm, normal. Liver: No focal lesion identified. Within normal limits in parenchymal echogenicity. Portal vein is patent on color Doppler imaging with normal direction of blood flow towards the liver. Other: No right upper quadrant ascites. IMPRESSION: Normal right upper quadrant ultrasound. Electronically Signed   By: Narda Rutherford M.D.   On: 06/29/2020 17:42   Medications Ordered in ED Medications - No data to display  ED Course  I have reviewed the triage vital signs and the nursing notes.  Pertinent labs & imaging results that were available during my care of the patient were reviewed by me and considered in my medical decision making (see chart for details).    MDM Rules/Calculators/A&P                           Medical Decision Making:  Bianca Mathis is a 25 y.o. female with no significant medical history, who presented to the ED today with chief complaint of right-sided abdominal pain right-sided upper quadrant to right sided lower quadrant primarily.   On my initial exam, the pt was in no acute distress.  She does have tenderness at the above locations.   Reviewed and confirmed nursing documentation for past medical history, family history, social history.  Patient evaluated in triage for suspected biliary disease with right upper quadrant ultrasound, LFTs with no focal abnormalities.  Urinalysis and UPT negative at this time Initial Assessment:  With the patient's presentation of right-sided  abdominal pain and urinary changes in the setting of negative biliary evaluation, most likely diagnosis is nephrolithiasis. Other diagnoses were considered including (but not limited to) appendicitis, cholangitis, small bowel obstruction. These are considered less likely due to history of present illness and physical exam findings.   Initial Plan: 1. Will add onto previous evaluation with CBC and CT abdomen pelvis stone study given presence of hematuria on dipstick 2. Objective evaluation as below reviewed independently and with attending guidance.   Initial Study Results:  Laboratory CBC showed no acute abnormality  Radiology US Abdomen Limited RUQ (LIVER/GB)  Final Result    CT ABDOMEN PELVIS WO CONTRAST    (Results Pending)    Final Assessment and Plan:  Given the scan findings of potential initiation of IBD and the strong family history,  Communicated with gastroenterology on-call.  They recommended initiation of steroid taper and outpatient follow-up.  Unfortunately, patient has limited follow-up options due to her social situation.  Patient will initiate care at the [facility] and follow-up with gastroenterology in the outpatient setting.  Final Clinical Impression(s) / ED Diagnoses Final diagnoses:  Right upper quadrant pain    Rx / DC Orders ED Discharge Orders    None       Glyn Ade, MD 06/29/20 2158    Clarene Duke Ambrose Finland, MD 07/02/20 [phone]

## 2020-06-29 NOTE — ED Triage Notes (Signed)
Patient from urgent care. Complaint of abdominal pain x2 weeks, worse today.

## 2020-06-29 NOTE — ED Triage Notes (Signed)
Emergency Medicine Provider Triage Evaluation Note  Bianca Mathis , a 25 y.o. female  was evaluated in triage.  Pt complains of  Right upper quadrant abdominal pain and bilateral lower abdominal pain.  Proggressively worse over the last 2 weeks.  No radiation of pain.  Minimal improvement of pain with tylenol.  Worse when bending over or deep inhalation.  No change with food.    Review of Systems  Positive: Diarrhea, shob Negative: Nausea, vomiting, fever, chills,  Blood in stool, chest pain  Physical Exam  BP 106/69   Pulse 91   Temp 99.1 F (37.3 C) (Oral)   Resp 14   LMP 06/19/2020   SpO2 99%  Gen:   Awake, no distress   HEENT:  Atraumatic  Resp:  Normal effort  Cardiac:  Normal rate  Abd:   Nondistended, soft, tenderness to RUQ LLQ and RLQ MSK:   Moves extremities without difficulty  Neuro:  Speech clear   Medical Decision Making  Medically screening exam initiated at 4:58 PM.  Appropriate orders placed.  Lemmie Steinhaus was informed that the remainder of the evaluation will be completed by another provider, this initial triage assessment does not replace that evaluation, and the importance of remaining in the ED until their evaluation is complete.  Clinical Impression   RUQ, LLQ and RLQ abdominal pain. The patient appears stable so that the remainder of the work up may be completed by another provider.     Haskel Schroeder, [place] 06/29/20 1704

## 2020-06-29 NOTE — Care Management (Signed)
ED RNCM met with patient at in ED rm20 to discuss assistance with f/u with GI.  Patient is uninsured and reports living in [place] ED RNCM discussed [facility] to establish primary care with referral to GI. Patient is agreeable with plan, ED CM will f/u on Monday with [facility] to assist with securing an appointment. Updated EDP on plan.

## 2020-06-29 NOTE — ED Triage Notes (Signed)
Pt presents abdominal pain xs 2 weeks. States cycle was 9 days rather than 4 days. States deep breaths, bending, and picking objects up makes pain worse.

## 2020-06-29 NOTE — Discharge Instructions (Addendum)
Please go directly to the emergency room 

## 2020-06-29 NOTE — Discharge Instructions (Signed)
You were seen today for abdominal pain.  Your work-up is overall not completely diagnostic.  Your CT scan does have some bowel wall thickening and signs of inflammation which given your family history of inflammatory bowel disease could be a sign of early IBD.

## 2020-07-02 ENCOUNTER — Telehealth (HOSPITAL_COMMUNITY): Payer: Self-pay | Admitting: Emergency Medicine

## 2020-07-02 LAB — URINE CULTURE: Culture: >=100000 — AB

## 2020-07-02 MED ORDER — SULFAMETHOXAZOLE-TRIMETHOPRIM 800-160 MG PO TABS: 1.0000 | ORAL_TABLET | Freq: Two times a day (BID) | ORAL | 0 refills | Status: AC

## 2020-07-02 NOTE — Telephone Encounter (Signed)
Called pharmacy and changed prescription for Bactrim to 3 days, per protocol.  Pharmacist cancelled old and placed new order.

## 2021-08-06 IMAGING — US US ABDOMEN LIMITED RUQ/ASCITES
1 series · 14 of 25 positions shown · non-contrast
Comparison: None.

CLINICAL DATA: Right upper quadrant pain.

EXAM:
ULTRASOUND ABDOMEN LIMITED RIGHT UPPER QUADRANT

[Series 1: us abdomen limited ruq (liver/gb) · 14 of 37 slices shown]
[im 1/37]
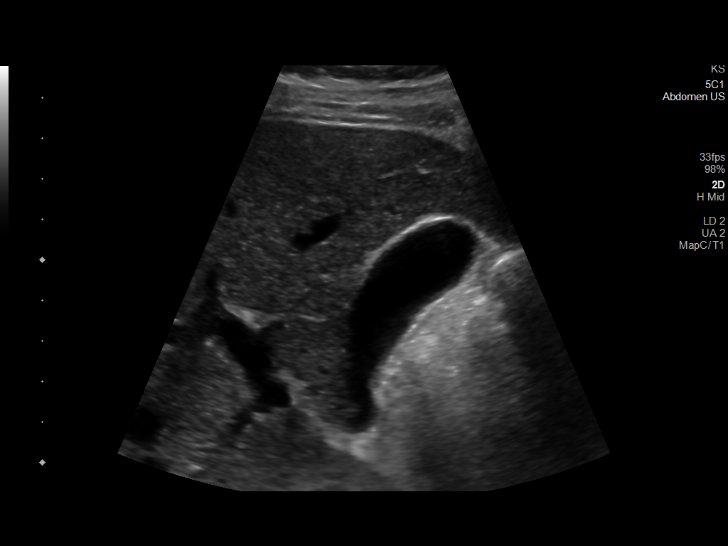
[im 4/37]
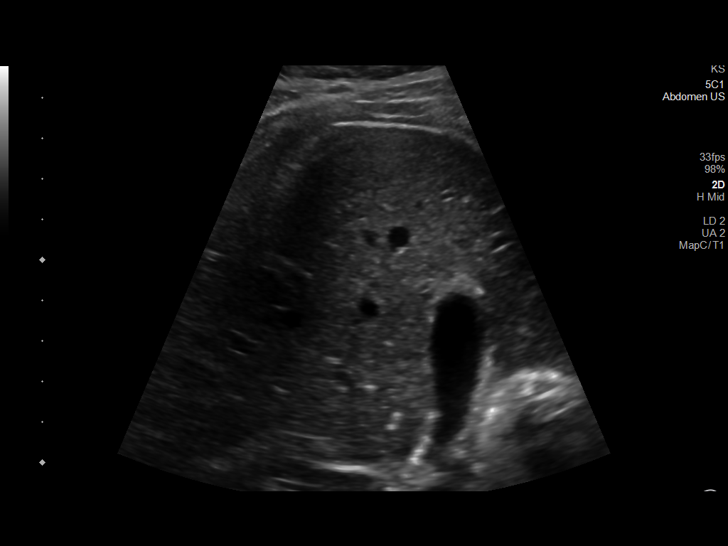
[im 7/37]
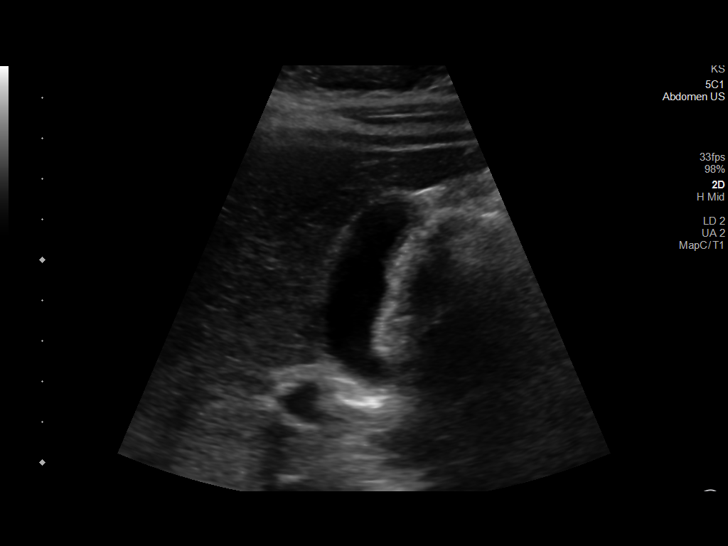
[im 10/37]
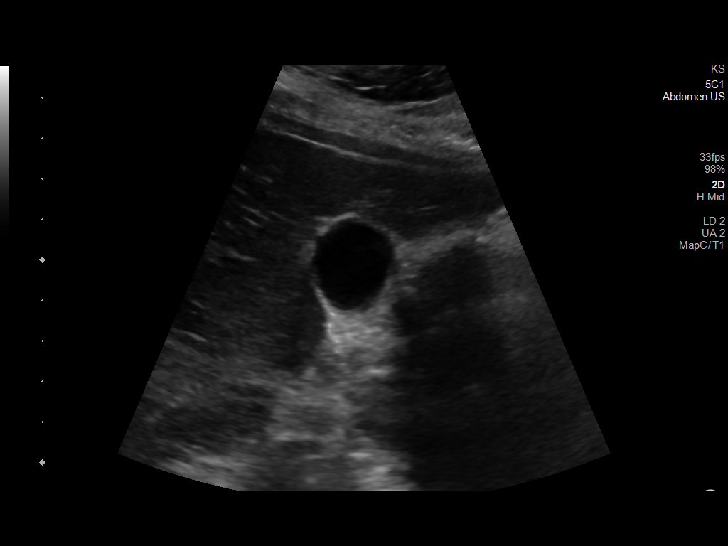
[im 13/37]
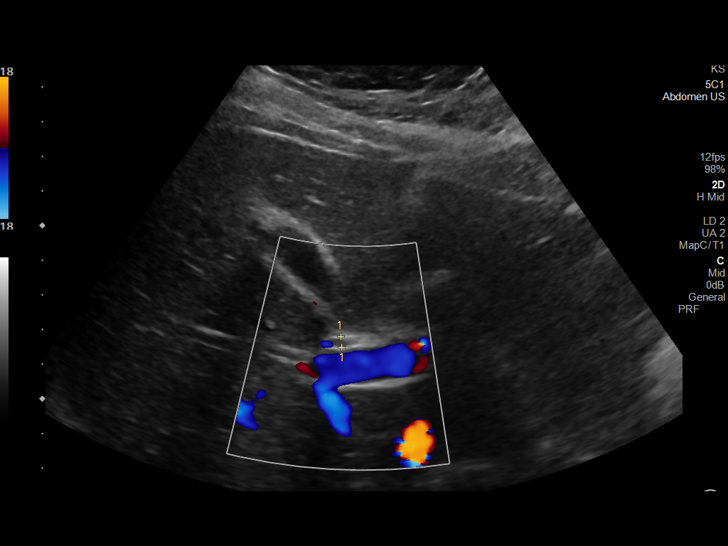
[im 14/37]
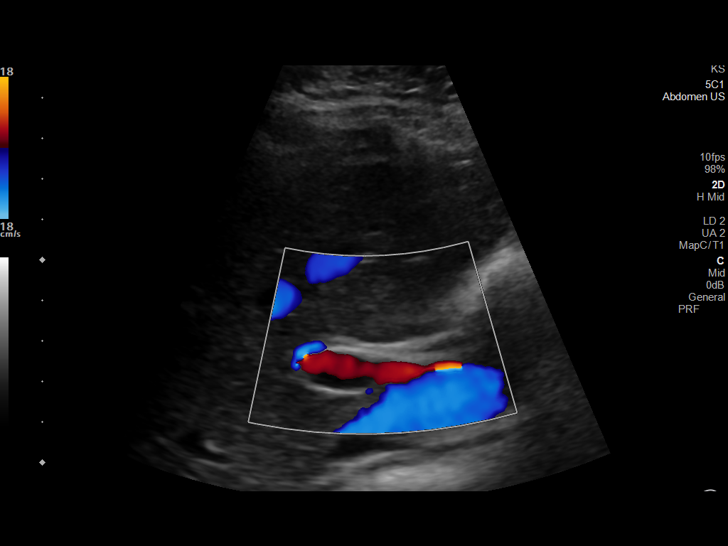
[im 17/37]
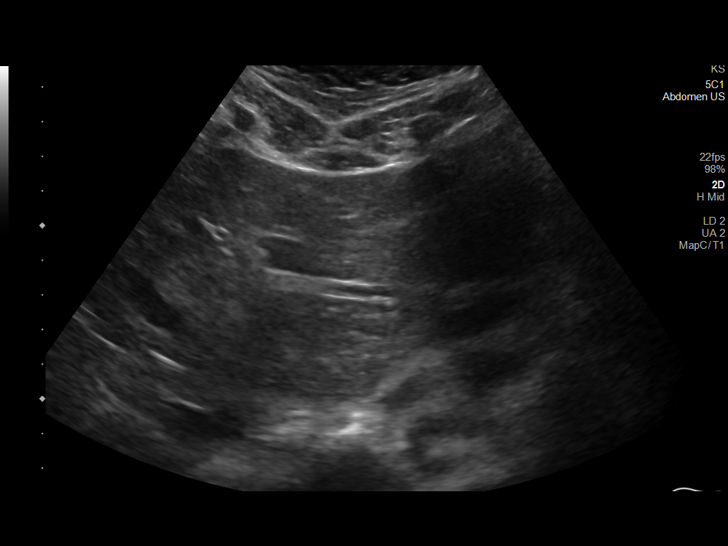
[im 20/37]
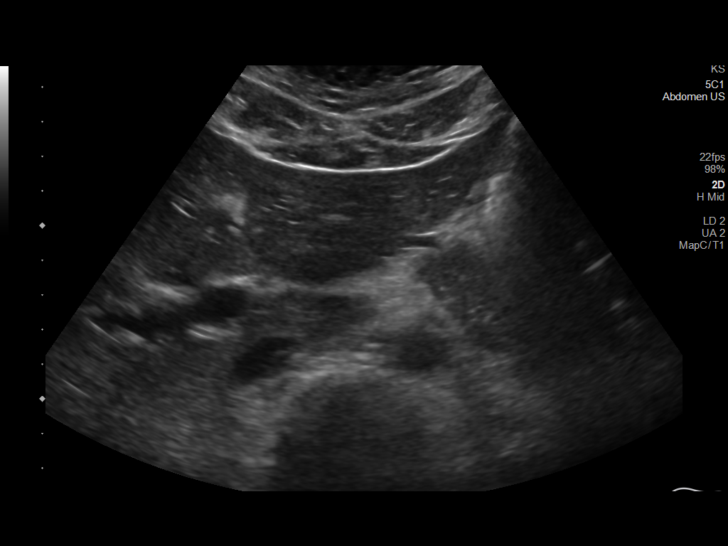
[im 23/37]
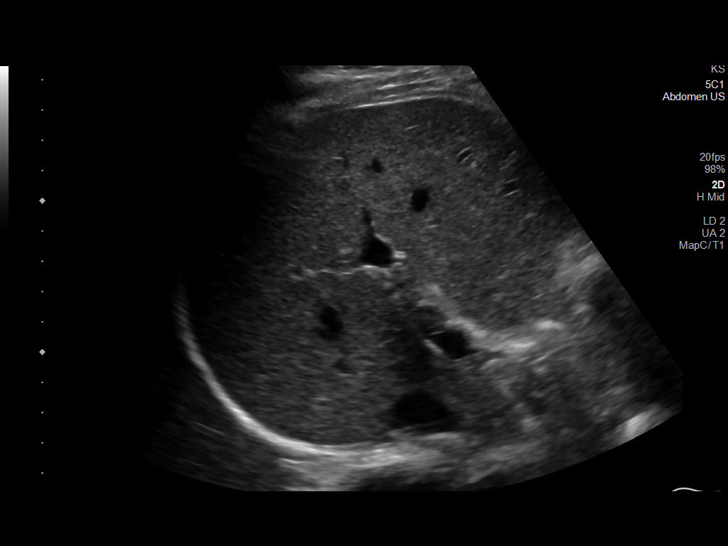
[im 25/37]
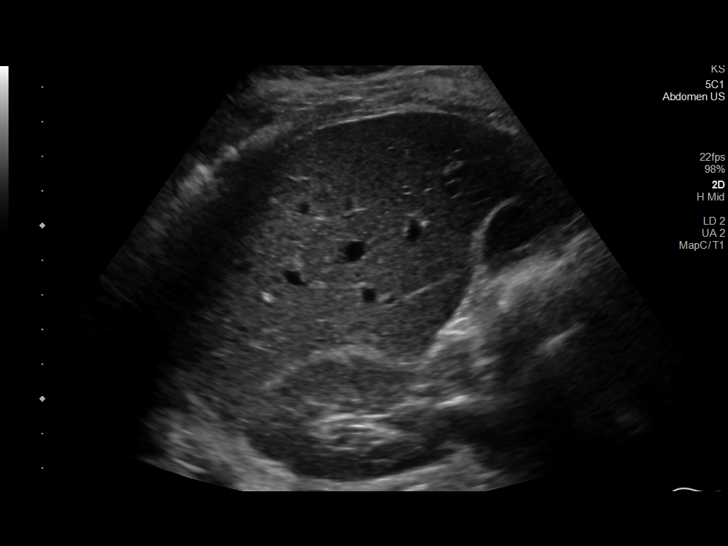
[im 28/37]
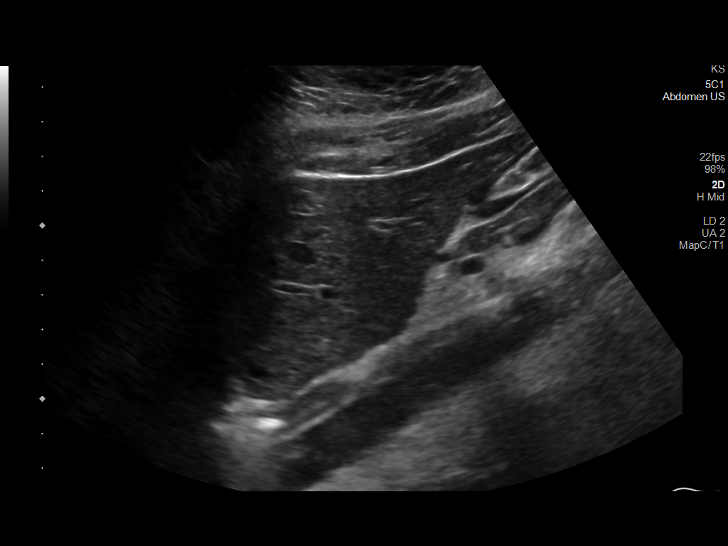
[im 31/37]
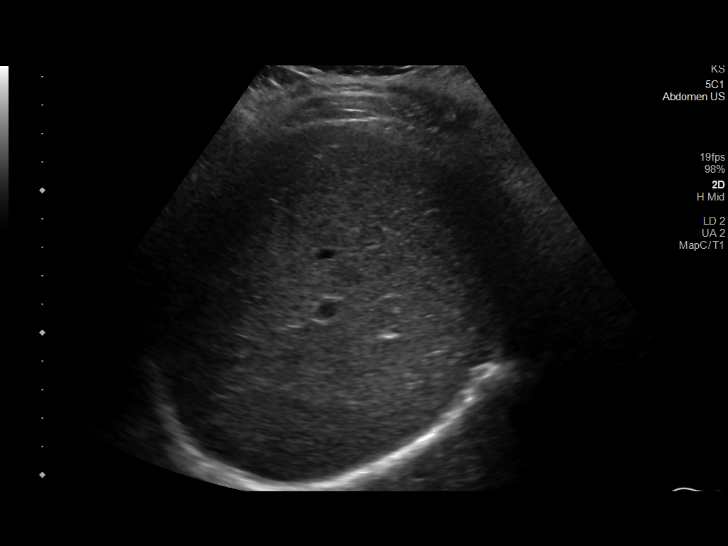
[im 34/37]
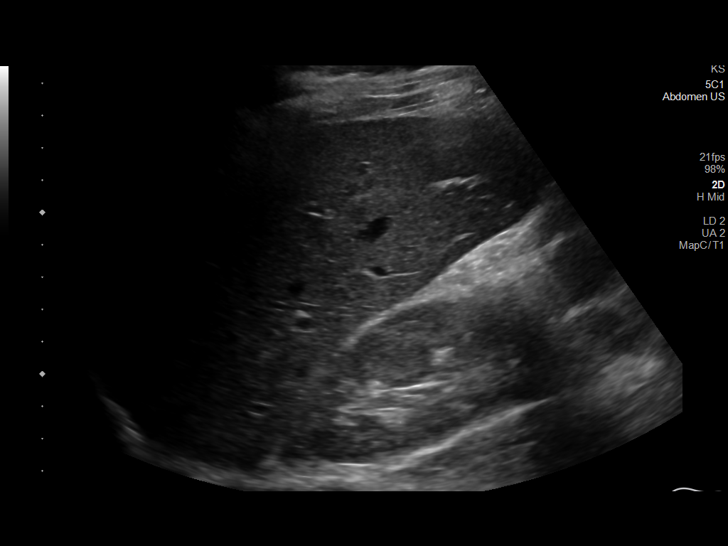
[im 37/37]
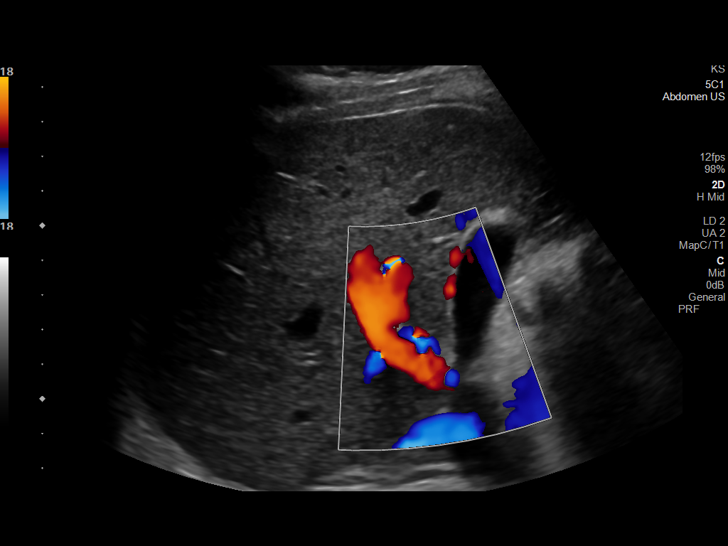

[14 of 25 positions shown; findings below may reference images not displayed]

FINDINGS: Gallbladder:

Physiologically distended. No gallstones or wall thickening
visualized. No sonographic Murphy sign noted by sonographer.

Common bile duct:

Diameter: 3 mm, normal.

Liver:

No focal lesion identified. Within normal limits in parenchymal
echogenicity. Portal vein is patent on color Doppler imaging with
normal direction of blood flow towards the liver.

Other: No right upper quadrant ascites.
IMPRESSION: Normal right upper quadrant ultrasound.

## 2021-08-06 IMAGING — CT CT ABD-PELV W/O CM
2 of 4 series · 17 of 46 positions shown, 19 images · non-contrast
Comparison: Ultrasound 06/29/2020

CLINICAL DATA: Abdomen pain

EXAM:
CT ABDOMEN AND PELVIS WITHOUT CONTRAST
TECHNIQUE: Multidetector CT imaging of the abdomen and pelvis was performed
following the standard protocol without IV contrast.

[Series 3: a/p w/o 5mm · axial · non-contrast · 0.98mm/px · z∈[+700,+1140]mm · 14 of 96 slices shown, 16 images]
[im 4/96  soft-tissue]
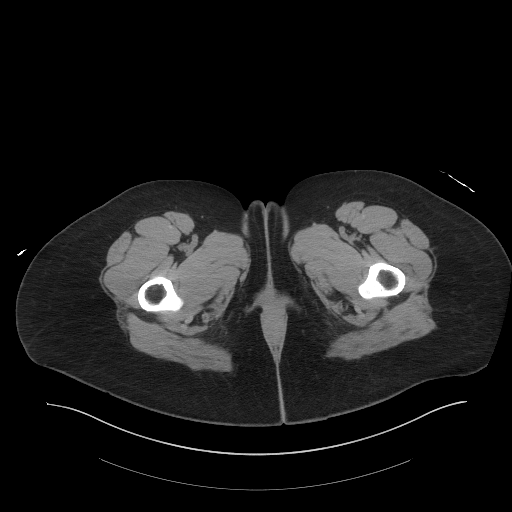
[im 4/96  bone]
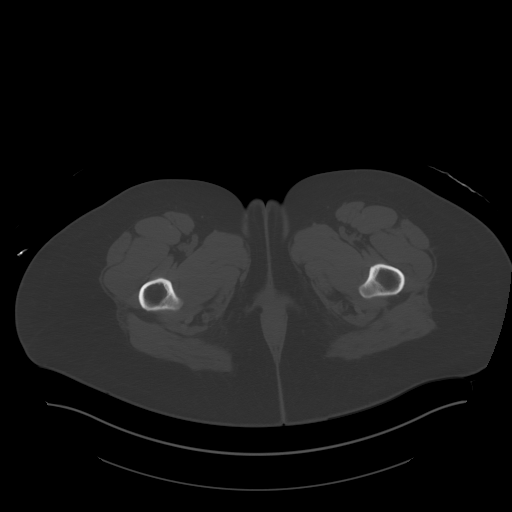
[im 12/96  soft-tissue]
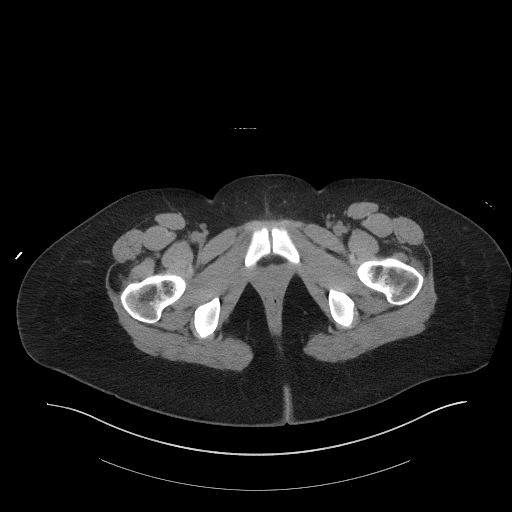
[im 20/96  soft-tissue]
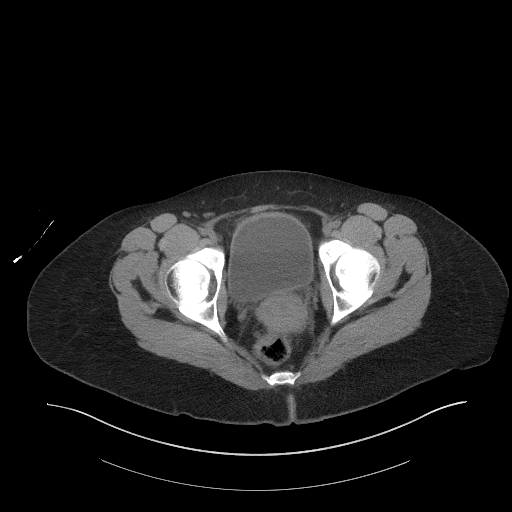
[im 24/96  soft-tissue]
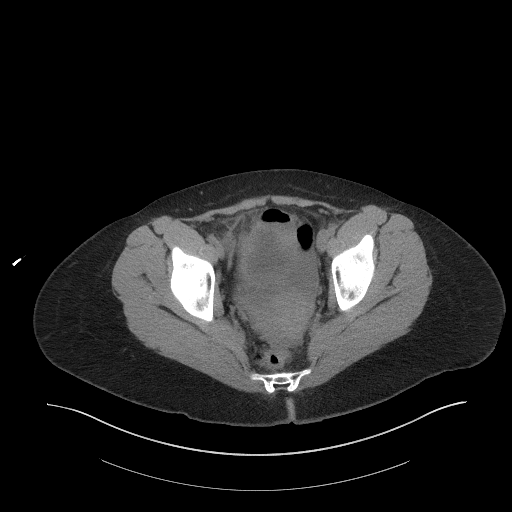
[im 32/96  soft-tissue]
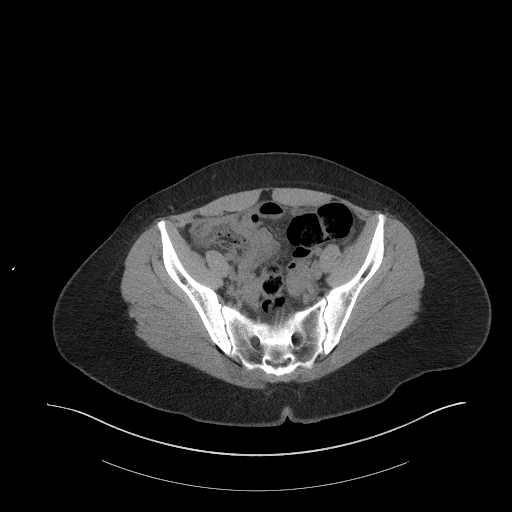
[im 40/96  soft-tissue]
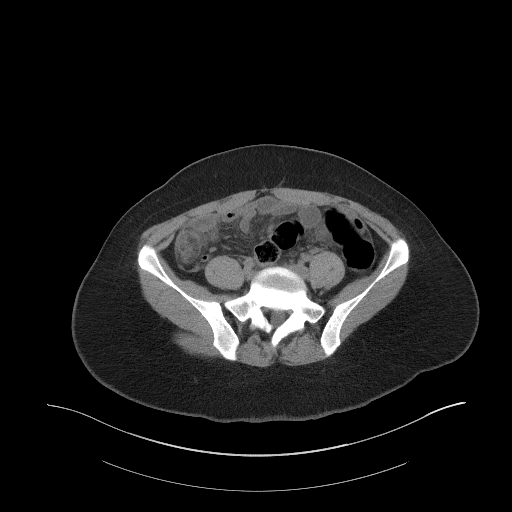
[im 44/96  soft-tissue]
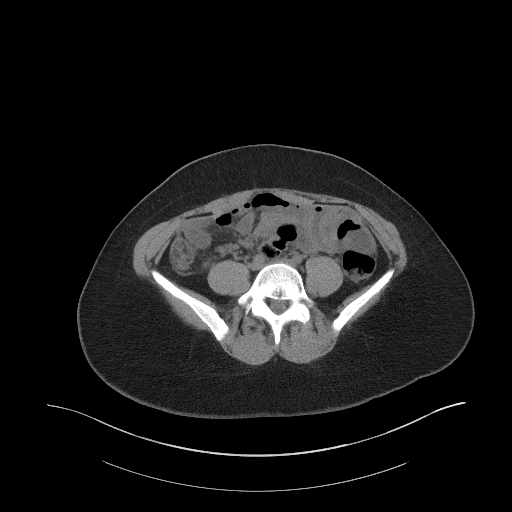
[im 52/96  soft-tissue]
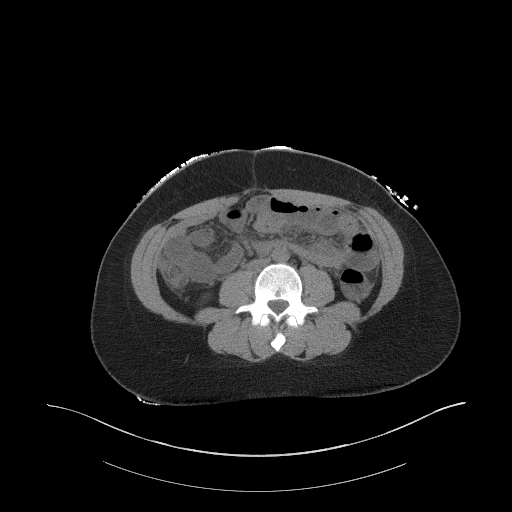
[im 56/96  soft-tissue]
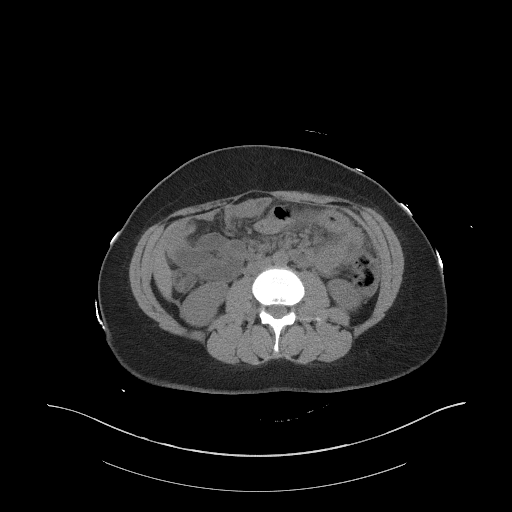
[im 56/96  bone]
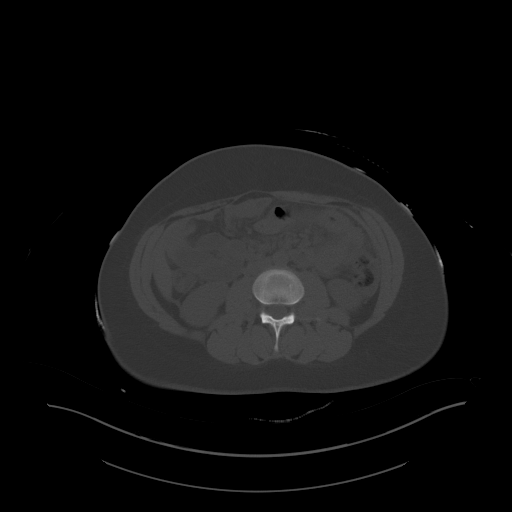
[im 64/96  soft-tissue]
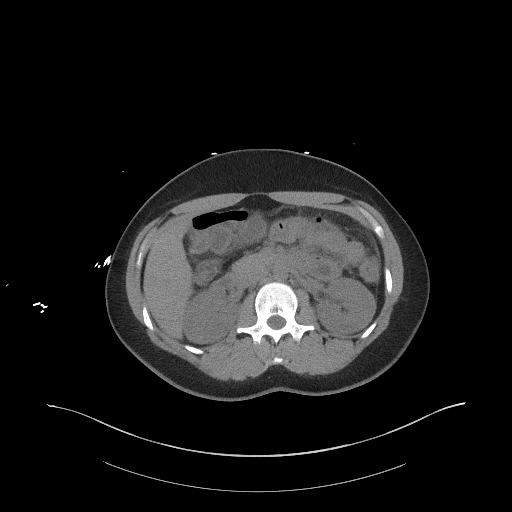
[im 72/96  soft-tissue]
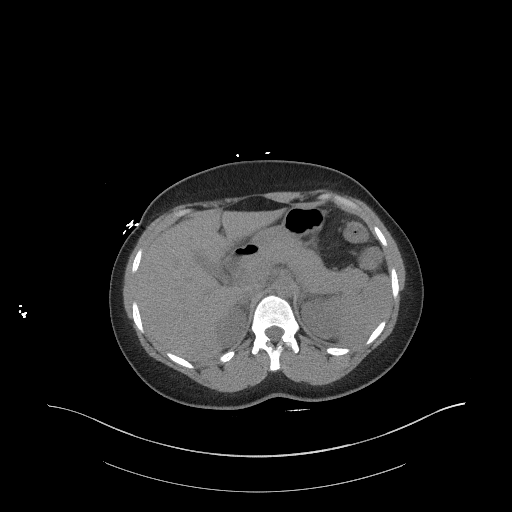
[im 76/96  soft-tissue]
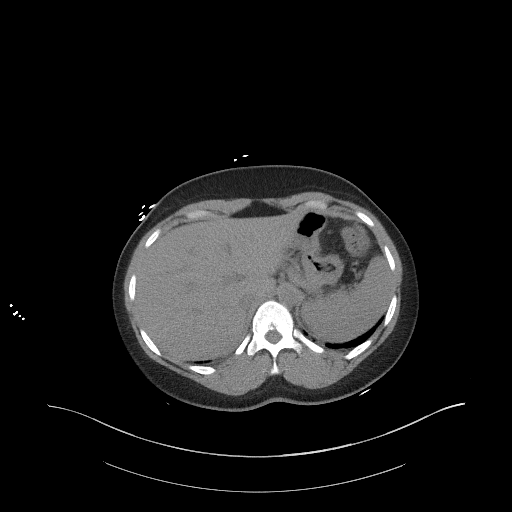
[im 84/96  soft-tissue]
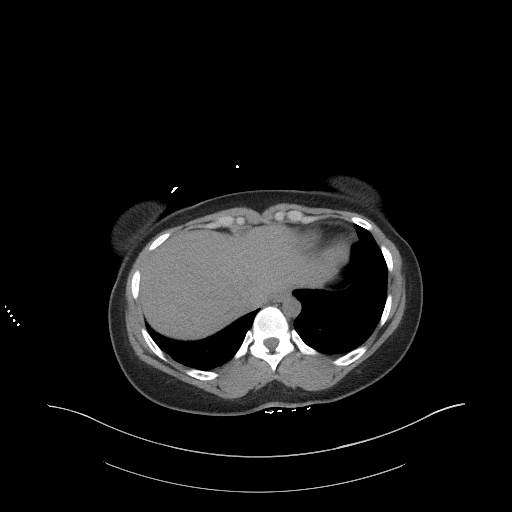
[im 92/96  soft-tissue]
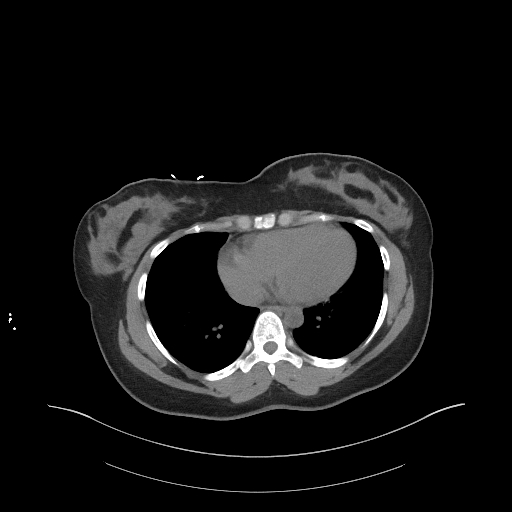

[Series 6: a/p w/o cor · coronal · non-contrast · 0.93mm/px · 3 of 144 slices shown]
[im 48/144  soft-tissue]
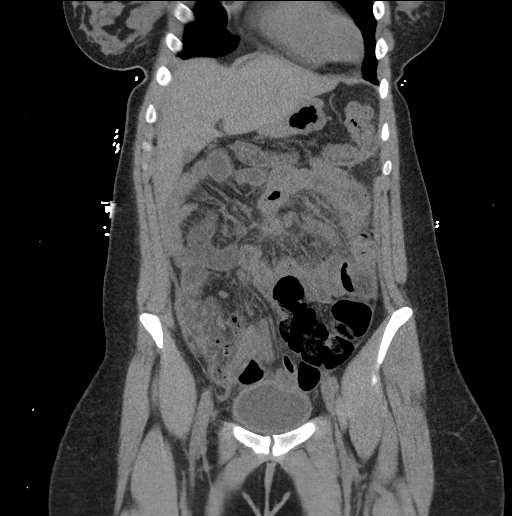
[im 64/144  soft-tissue]
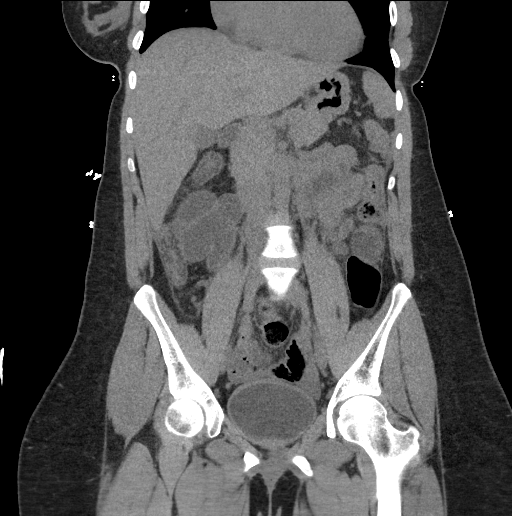
[im 80/144  soft-tissue]
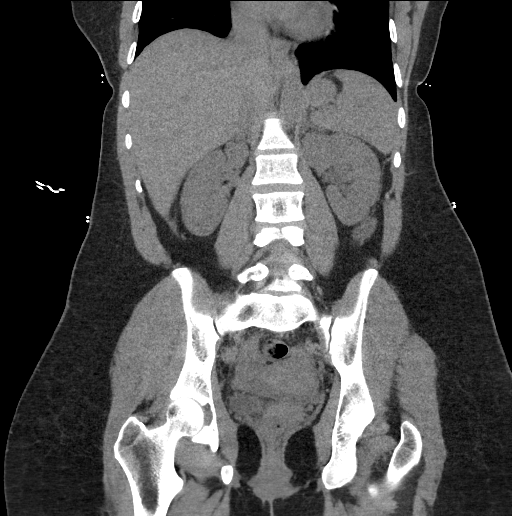

[17 of 46 positions shown; findings below may reference images not displayed]

FINDINGS: Lower chest: Lung bases demonstrate no acute consolidation or
effusion.

Hepatobiliary: No focal liver abnormality is seen. No gallstones,
gallbladder wall thickening, or biliary dilatation.

Pancreas: Unremarkable. No pancreatic ductal dilatation or
surrounding inflammatory changes.

Spleen: Normal in size without focal abnormality.

Adrenals/Urinary Tract: Adrenal glands are unremarkable. Kidneys are
normal, without renal calculi, focal lesion, or hydronephrosis.
Bladder is unremarkable.

Stomach/Bowel: The stomach is nonenlarged. Suspected bowel wall
thickening of left upper quadrant jejunal small bowel loops. Wall
thickening involves majority of colon with relative sparing of the
sigmoid and rectosigmoid colon. The appendix is normal.

Vascular/Lymphatic: Nonaneurysmal aorta.  No suspicious nodes.

Reproductive: Uterus and bilateral adnexa are unremarkable.

Other: No free air. Small free fluid in the pelvis. Mesenteric soft
tissue stranding. Trace fluid within the colic gutters.

Musculoskeletal: No acute or significant osseous findings.
IMPRESSION: 1. Negative for acute appendicitis.
2. Wall thickening and mild inflammatory changes involving most of
the colon with additional finding of thickened jejunal small bowel
loops in the left upper quadrant, also with associated mesenteric
soft tissue stranding. Findings suggest enterocolitis either due to
infection or inflammatory bowel disease.
3. Small amount of free fluid in the abdomen and pelvis.

## 2021-12-08 ENCOUNTER — Ambulatory Visit
Admission: EM | Admit: 2021-12-08 | Discharge: 2021-12-08 | Disposition: A | Discharge status: Home / Self Care | Payer: Medicaid Other | Attending: Urgent Care | Admitting: Urgent Care

## 2021-12-08 DIAGNOSIS — B349 Viral infection, unspecified: Secondary | ICD-10-CM | POA: Insufficient documentation

## 2021-12-08 DIAGNOSIS — Z20822 Contact with and (suspected) exposure to covid-19: Secondary | ICD-10-CM | POA: Insufficient documentation

## 2021-12-08 LAB — SARS CORONAVIRUS 2 (TAT 6-24 HRS): SARS Coronavirus 2: POSITIVE — AB

## 2021-12-08 MED ORDER — PROMETHAZINE-DM 6.25-15 MG/5ML PO SYRP: 2.5000 mL | ORAL_SOLUTION | Freq: Three times a day (TID) | ORAL | 0 refills | Status: DC | PRN

## 2021-12-08 MED ORDER — PSEUDOEPHEDRINE HCL 30 MG PO TABS: 30.0000 mg | ORAL_TABLET | Freq: Three times a day (TID) | ORAL | 0 refills | Status: DC | PRN

## 2021-12-08 MED ORDER — CETIRIZINE HCL 10 MG PO TABS: 10.0000 mg | ORAL_TABLET | Freq: Every day | ORAL | 0 refills | Status: DC

## 2021-12-08 MED ORDER — BENZONATATE 100 MG PO CAPS: 100.0000 mg | ORAL_CAPSULE | Freq: Three times a day (TID) | ORAL | 0 refills | Status: DC | PRN

## 2021-12-08 NOTE — ED Notes (Addendum)
Symptoms started Saturday, Post covid Exposure Runny Nose, Cough, Chills, tightness in chest.  No pain

## 2021-12-08 NOTE — Discharge Instructions (Signed)
We will notify you of your test results as they arrive and may take between about 24 hours.  I encourage you to sign up for MyChart if you have not already done so as this can be the easiest way for us to communicate results to you online or through a phone app.  Generally, we only contact you if it is a positive test result.  In the meantime, if you develop worsening symptoms including fever, chest pain, shortness of breath despite our current treatment plan then please report to the emergency room as this may be a sign of worsening status from possible viral infection.  Otherwise, we will manage this as a viral syndrome. For sore throat or cough try using a honey-based tea. Use 3 teaspoons of honey with juice squeezed from half lemon. Place shaved pieces of ginger into 1/2-1 cup of water and warm over stove top. Then mix the ingredients and repeat every 4 hours as needed. Please take Tylenol 500mg-650mg every 6 hours for aches and pains, fevers. Hydrate very well with at least 2 liters of water. Eat light meals such as soups to replenish electrolytes and soft fruits, veggies. Start an antihistamine like Zyrtec for postnasal drainage, sinus congestion.  You can take this together with pseudoephedrine (Sudafed) at a dose of 30 mg 2-3 times a day as needed for the same kind of congestion.  Use the cough medications as needed.   

## 2021-12-08 NOTE — ED Triage Notes (Signed)
Symptoms started Saturday, Post covid Exposure Runny Nose, Cough, Chills, tightness in chest.

## 2021-12-08 NOTE — ED Provider Notes (Signed)
  [facility]  Note:  This document was prepared using Systems analyst and may include unintentional dictation errors.  MRN: [id number] DOB: 11-27-1995  Subjective:   Bianca Mathis is a 26 y.o. female presenting for 3-day history of acute onset persistent runny nose, cough, chills, chest tightness.  Patient had very close exposure to COVID-19.  Does not want COVID antivirals.  No history of respiratory disorders.  No drug use, smoking.  No chest pain, shortness of breath or wheezing.  No chronic medications.  Allergies  Allergen Reactions   Shellfish Allergy Anaphylaxis   Nickel Hives    History reviewed. No pertinent past medical history.   History reviewed. No pertinent surgical history.  History reviewed. No pertinent family history.  Social History   Tobacco Use   Smoking status: Never   Smokeless tobacco: Never  Vaping Use   Vaping Use: Never used  Substance Use Topics   Alcohol use: Never   Drug use: Never    ROS   Objective:   Vitals: BP 106/72 (BP Location: Left Arm)   Pulse 96   Temp 99.7 F (37.6 C) (Oral)   Resp 16   SpO2 97%   Physical Exam Constitutional:      General: She is not in acute distress.    Appearance: Normal appearance. She is well-developed. She is not ill-appearing, toxic-appearing or diaphoretic.  HENT:     Head: Normocephalic and atraumatic.     Nose: Nose normal.     Mouth/Throat:     Mouth: Mucous membranes are moist.  Eyes:     General: No scleral icterus.       Right eye: No discharge.        Left eye: No discharge.     Extraocular Movements: Extraocular movements intact.  Cardiovascular:     Rate and Rhythm: Normal rate and regular rhythm.     Heart sounds: Normal heart sounds. No murmur heard.    No friction rub. No gallop.  Pulmonary:     Effort: Pulmonary effort is normal. No respiratory distress.     Breath sounds: No stridor. No decreased breath sounds, wheezing,  rhonchi or rales.  Chest:     Chest wall: No tenderness.  Skin:    General: Skin is warm and dry.  Neurological:     General: No focal deficit present.     Mental Status: She is alert and oriented to person, place, and time.  Psychiatric:        Mood and Affect: Mood normal.        Behavior: Behavior normal.     Assessment and Plan :   PDMP not reviewed this encounter.  1. Acute viral syndrome   2. Close exposure to COVID-19 virus     Deferred imaging given clear cardiopulmonary exam, hemodynamically stable vital signs. Will manage for viral illness such as viral URI, viral syndrome, viral rhinitis, COVID-19. Recommended supportive care. Offered scripts for symptomatic relief. Testing is pending. Counseled patient on potential for adverse effects with medications prescribed/recommended today, ER and return-to-clinic precautions discussed, patient verbalized understanding.     Jaynee Eagles, PA-C 12/08/21 1020

## 2023-10-27 ENCOUNTER — Ambulatory Visit (HOSPITAL_COMMUNITY)
Admission: EM | Admit: 2023-10-27 | Discharge: 2023-10-27 | Disposition: A | Discharge status: Home / Self Care | Payer: No Typology Code available for payment source | Attending: Emergency Medicine | Admitting: Emergency Medicine

## 2023-10-27 DIAGNOSIS — M7551 Bursitis of right shoulder: Secondary | ICD-10-CM | POA: Diagnosis not present

## 2023-10-27 DIAGNOSIS — M7521 Bicipital tendinitis, right shoulder: Secondary | ICD-10-CM

## 2023-10-27 MED ORDER — PREDNISONE 20 MG PO TABS: 40.0000 mg | ORAL_TABLET | Freq: Every day | ORAL | 0 refills | Status: AC

## 2023-10-27 NOTE — ED Provider Notes (Signed)
 [facility]    CSN: [id number] Arrival date & time: 10/27/23  1643      History   Chief Complaint Chief Complaint  Patient presents with   Shoulder Pain    HPI Bianca Mathis is a 28 y.o. female.  4 days of right shoulder/arm pain Rating 6/10 currently  Feels numbness/tingling in the fingers occasionally with certain movement  She does repetitive motion at work [employer] for [employer]) Additionally drill for the [employer], and also does power lifting  No trauma or fall   Took meloxicam  for a few doses  No past medical history on file.  There are no active problems to display for this patient.   No past surgical history on file.  OB History   No obstetric history on file.      Home Medications    Prior to Admission medications   Medication Sig Start Date End Date Taking? Authorizing Provider  predniSONE  (DELTASONE ) 20 MG tablet Take 2 tablets (40 mg total) by mouth daily with breakfast for 5 days. 10/28/23 11/02/23 Yes Xayne Brumbaugh, [place] RIGGERS    Family History No family history on file.  Social History Social History   Tobacco Use   Smoking status: Never   Smokeless tobacco: Never  Vaping Use   Vaping status: Never Used  Substance Use Topics   Alcohol use: Never   Drug use: Never     Allergies   Shellfish allergy and Nickel   Review of Systems Review of Systems As per HPI  Physical Exam Triage Vital Signs ED Triage Vitals  Encounter Vitals Group     BP 10/27/23 1816 (!) 134/93     Girls Systolic BP Percentile --      Girls Diastolic BP Percentile --      Boys Systolic BP Percentile --      Boys Diastolic BP Percentile --      Pulse Rate 10/27/23 1816 85     Resp 10/27/23 1816 18     Temp 10/27/23 1816 97.9 F (36.6 C)     Temp Source 10/27/23 1816 Oral     SpO2 10/27/23 1816 98 %     Weight --      Height --      Head Circumference --      Peak Flow --      Pain Score 10/27/23 1819 6     Pain Loc --       Pain Education --      Exclude from Growth Chart --    No data found.  Updated Vital Signs BP (!) 134/93 (BP Location: Left Arm)   Pulse 85   Temp 97.9 F (36.6 C) (Oral)   Resp 18   SpO2 98%   Physical Exam Vitals and nursing note reviewed.  Constitutional:      General: She is not in acute distress. HENT:     Mouth/Throat:     Pharynx: Oropharynx is clear.  Cardiovascular:     Rate and Rhythm: Normal rate and regular rhythm.     Pulses: Normal pulses.  Pulmonary:     Effort: Pulmonary effort is normal.  Musculoskeletal:     Right shoulder: Tenderness present. No swelling, deformity or bony tenderness. Normal strength. Normal pulse.       Arms:     Cervical back: Normal range of motion.     Comments: Tenderness of right biceps tendon. Good ROM despite pain, can lift to 90 degrees all fields. Negative  empty can test. Does have pain with external rotation at the shoulder. Strength is intact, sensation normal. Radial pulse 2+. Cap refill fingers < 2 seconds   Skin:    Capillary Refill: Capillary refill takes less than 2 seconds.  Neurological:     Mental Status: She is alert and oriented to person, place, and time.     UC Treatments / Results  Labs (all labs ordered are listed, but only abnormal results are displayed) Labs Reviewed - No data to display  EKG   Radiology No results found.  Procedures Procedures (including critical care time)  Medications Ordered in UC Medications - No data to display  Initial Impression / Assessment and Plan / UC Course  I have reviewed the triage vital signs and the nursing notes.  Pertinent labs & imaging results that were available during my care of the patient were reviewed by me and considered in my medical decision making (see chart for details).  Bursitis vs tendonitis , or combination of the two Prednisone  burst. No NSAIDs while using. Ice recommended. Rest is highly advised. Discussed repetitive motions and strenuous  activity, heavy lifting can worsen symptoms. Note for work provided. Orthopedic follow up  Final Clinical Impressions(s) / UC Diagnoses   Final diagnoses:  Acute bursitis of right shoulder  Biceps tendonitis, right     Discharge Instructions      Prednisone  -- 2 tablets daily for 5 days  Ice applied for 20 minutes several times daily Please try to rest. Avoid strenuous activity and heavy lifting   Please follow up with orthopedics if your pain is persisting      ED Prescriptions     Medication Sig Dispense Auth. Provider   predniSONE  (DELTASONE ) 20 MG tablet Take 2 tablets (40 mg total) by mouth daily with breakfast for 5 days. 10 tablet Jolanda Mccann, [place], PA-C      PDMP not reviewed this encounter.   Jeryl [place], [place] 10/27/23 [id number]

## 2023-10-27 NOTE — ED Triage Notes (Signed)
 Patient presents with right shoulder pain x 4 days.Patient states she had numbness and tingling in her right arm.  Home Intervention: None Denies any recent trauma or injuries.  Pain 06/10

## 2023-10-27 NOTE — Discharge Instructions (Addendum)
 Prednisone  -- 2 tablets daily for 5 days  Ice applied for 20 minutes several times daily Please try to rest. Avoid strenuous activity and heavy lifting   Please follow up with orthopedics if your pain is persisting
